# Patient Record
Sex: Female | Born: 1995 | Race: Black or African American | Hispanic: No | Marital: Single | Smoking: Former smoker
Health system: Southern US, Community
[De-identification: ages and names within clinical notes are randomized; demographics above are authoritative.]

## PROBLEM LIST (undated history)

## (undated) DIAGNOSIS — D649 Anemia, unspecified: Secondary | ICD-10-CM

## (undated) HISTORY — PX: DILATION AND CURETTAGE OF UTERUS: SHX78

---

## 2017-07-11 DIAGNOSIS — Z6791 Unspecified blood type, Rh negative: Secondary | ICD-10-CM | POA: Diagnosis not present

## 2017-07-11 DIAGNOSIS — Z131 Encounter for screening for diabetes mellitus: Secondary | ICD-10-CM | POA: Diagnosis not present

## 2017-07-11 DIAGNOSIS — Z3A27 27 weeks gestation of pregnancy: Secondary | ICD-10-CM | POA: Diagnosis not present

## 2017-07-11 DIAGNOSIS — Z114 Encounter for screening for human immunodeficiency virus [HIV]: Secondary | ICD-10-CM | POA: Diagnosis not present

## 2017-07-11 DIAGNOSIS — Z3A25 25 weeks gestation of pregnancy: Secondary | ICD-10-CM | POA: Diagnosis not present

## 2017-07-11 DIAGNOSIS — O26892 Other specified pregnancy related conditions, second trimester: Secondary | ICD-10-CM | POA: Diagnosis not present

## 2017-07-25 DIAGNOSIS — Z131 Encounter for screening for diabetes mellitus: Secondary | ICD-10-CM | POA: Diagnosis not present

## 2017-07-25 DIAGNOSIS — Z3A25 25 weeks gestation of pregnancy: Secondary | ICD-10-CM | POA: Diagnosis not present

## 2017-07-25 DIAGNOSIS — Z3A27 27 weeks gestation of pregnancy: Secondary | ICD-10-CM | POA: Diagnosis not present

## 2017-07-25 DIAGNOSIS — Z114 Encounter for screening for human immunodeficiency virus [HIV]: Secondary | ICD-10-CM | POA: Diagnosis not present

## 2017-07-25 DIAGNOSIS — O26892 Other specified pregnancy related conditions, second trimester: Secondary | ICD-10-CM | POA: Diagnosis not present

## 2017-07-25 DIAGNOSIS — Z6791 Unspecified blood type, Rh negative: Secondary | ICD-10-CM | POA: Diagnosis not present

## 2017-09-29 DIAGNOSIS — M545 Low back pain: Secondary | ICD-10-CM | POA: Diagnosis not present

## 2017-09-29 DIAGNOSIS — O26899 Other specified pregnancy related conditions, unspecified trimester: Secondary | ICD-10-CM | POA: Diagnosis not present

## 2017-10-23 DIAGNOSIS — Z3A40 40 weeks gestation of pregnancy: Secondary | ICD-10-CM | POA: Diagnosis not present

## 2017-10-24 DIAGNOSIS — O480 Post-term pregnancy: Secondary | ICD-10-CM | POA: Diagnosis not present

## 2017-10-24 DIAGNOSIS — Z3A40 40 weeks gestation of pregnancy: Secondary | ICD-10-CM | POA: Diagnosis not present

## 2018-11-21 DIAGNOSIS — Z3201 Encounter for pregnancy test, result positive: Secondary | ICD-10-CM | POA: Diagnosis not present

## 2019-03-03 ENCOUNTER — Emergency Department (HOSPITAL_COMMUNITY): Payer: Medicaid Other

## 2019-03-03 ENCOUNTER — Other Ambulatory Visit: Payer: Self-pay

## 2019-03-03 ENCOUNTER — Encounter (HOSPITAL_COMMUNITY): Payer: Self-pay | Admitting: Emergency Medicine

## 2019-03-03 ENCOUNTER — Emergency Department (HOSPITAL_COMMUNITY)
Admission: EM | Admit: 2019-03-03 | Discharge: 2019-03-04 | Disposition: A | Discharge status: Home / Self Care | Payer: Medicaid Other | Attending: Emergency Medicine | Admitting: Emergency Medicine

## 2019-03-03 DIAGNOSIS — O209 Hemorrhage in early pregnancy, unspecified: Secondary | ICD-10-CM | POA: Diagnosis not present

## 2019-03-03 DIAGNOSIS — Z6711 Type A blood, Rh negative: Secondary | ICD-10-CM | POA: Diagnosis not present

## 2019-03-03 DIAGNOSIS — R1084 Generalized abdominal pain: Secondary | ICD-10-CM | POA: Insufficient documentation

## 2019-03-03 DIAGNOSIS — Z3A00 Weeks of gestation of pregnancy not specified: Secondary | ICD-10-CM | POA: Insufficient documentation

## 2019-03-03 DIAGNOSIS — O039 Complete or unspecified spontaneous abortion without complication: Secondary | ICD-10-CM

## 2019-03-03 LAB — CBC WITH DIFFERENTIAL/PLATELET
Abs Immature Granulocytes: 0.02 10*3/uL (ref 0.00–0.07)
Basophils Absolute: 0 10*3/uL (ref 0.0–0.1)
Basophils Relative: 1 %
Eosinophils Absolute: 0 10*3/uL (ref 0.0–0.5)
Eosinophils Relative: 0 %
HCT: 40.3 % (ref 36.0–46.0)
Hemoglobin: 12.4 g/dL (ref 12.0–15.0)
Immature Granulocytes: 0 %
Lymphocytes Relative: 38 %
Lymphs Abs: 2.7 10*3/uL (ref 0.7–4.0)
MCH: 23.7 pg — ABNORMAL LOW (ref 26.0–34.0)
MCHC: 30.8 g/dL (ref 30.0–36.0)
MCV: 76.9 fL — ABNORMAL LOW (ref 80.0–100.0)
Monocytes Absolute: 0.6 10*3/uL (ref 0.1–1.0)
Monocytes Relative: 8 %
Neutro Abs: 3.7 10*3/uL (ref 1.7–7.7)
Neutrophils Relative %: 53 %
Platelets: 317 10*3/uL (ref 150–400)
RBC: 5.24 MIL/uL — ABNORMAL HIGH (ref 3.87–5.11)
RDW: 13 % (ref 11.5–15.5)
WBC: 7.1 10*3/uL (ref 4.0–10.5)
nRBC: 0 % (ref 0.0–0.2)

## 2019-03-03 LAB — I-STAT BETA HCG BLOOD, ED (MC, WL, AP ONLY): I-stat hCG, quantitative: 14.3 m[IU]/mL — ABNORMAL HIGH (ref <5)

## 2019-03-03 LAB — ABO/RH: ABO/RH(D): A NEG

## 2019-03-03 NOTE — ED Triage Notes (Signed)
Patient is complaining of blood clots, back pain, and cramping that started at 2 am. Patient states she does not know how far along she is. She just found out she is pregnant.

## 2019-03-03 NOTE — ED Provider Notes (Signed)
[facility] DEPT Provider Note   CSN: [id number] Arrival date & time: 03/03/19  2023     History Chief Complaint  Patient presents with  . Miscarriage    Regina Nicholson is a 24 y.o. female.  The history is provided by the patient and medical records.    24 year old G3P1 here with vaginal bleeding.  States her periods are very erratic and she has no idea of her LMP.  She did take 3 home pregnancy tests last week that were all positive.  At 2am today she developed heavy vaginal bleeding with some clots intermixed.  She denies any noted discharge.  She has not been on any form of birth control.  No fevers/chills.  No urinary symptoms.  History reviewed. No pertinent past medical history.  There are no problems to display for this patient.   History reviewed. No pertinent surgical history.   OB History    Gravida  1   Para      Term      Preterm      AB      Living        SAB      TAB      Ectopic      Multiple      Live Births              History reviewed. No pertinent family history.  Social History   Tobacco Use  . Smoking status: Former Research scientist (life sciences)  . Smokeless tobacco: Never Used  Substance Use Topics  . Alcohol use: Yes  . Drug use: Never    Home Medications Prior to Admission medications   Not on File    Allergies    Patient has no known allergies.  Review of Systems   Review of Systems  Genitourinary: Positive for vaginal bleeding.  All other systems reviewed and are negative.   Physical Exam Updated Vital Signs BP (!) 144/94 (BP Location: Left Arm)   Pulse 79   Temp 98.3 F (36.8 C) (Oral)   Resp 16   Ht 5\' 2"  (1.575 m)   Wt 49.9 kg   SpO2 100%   BMI 20.12 kg/m   Physical Exam Vitals and nursing note reviewed.  Constitutional:      Appearance: She is well-developed.  HENT:     Head: Normocephalic and atraumatic.  Eyes:     Conjunctiva/sclera: Conjunctivae normal.     Pupils: Pupils are  equal, round, and reactive to light.  Cardiovascular:     Rate and Rhythm: Normal rate and regular rhythm.     Heart sounds: Normal heart sounds.  Pulmonary:     Effort: Pulmonary effort is normal.     Breath sounds: Normal breath sounds.  Abdominal:     General: Bowel sounds are normal. There is no distension.     Palpations: Abdomen is soft.     Tenderness: There is no abdominal tenderness. There is no rebound.  Musculoskeletal:        General: Normal range of motion.     Cervical back: Normal range of motion.  Skin:    General: Skin is warm and dry.  Neurological:     Mental Status: She is alert and oriented to person, place, and time.     ED Results / Procedures / Treatments   Labs (all labs ordered are listed, but only abnormal results are displayed) Labs Reviewed  CBC WITH DIFFERENTIAL/PLATELET - Abnormal; Notable for the following components:  Result Value   RBC 5.24 (*)    MCV 76.9 (*)    MCH 23.7 (*)    All other components within normal limits  I-STAT BETA HCG BLOOD, ED ([facility], [facility], [facility] ONLY) - Abnormal; Notable for the following components:   I-stat hCG, quantitative 14.3 (*)    All other components within normal limits  ABO/RH  RH IG WORKUP (INCLUDES ABO/RH)    EKG None  Radiology US OB Comp Less 14 Wks  Result Date: 03/04/2019 CLINICAL DATA:  24 year old female with positive HCG level and heavy vaginal bleeding. EXAM: OBSTETRIC <14 WK [place] AND TRANSVAGINAL OB US TECHNIQUE: Both transabdominal and transvaginal ultrasound examinations were performed for complete evaluation of the gestation as well as the maternal uterus, adnexal regions, and pelvic cul-de-sac. Transvaginal technique was performed to assess early pregnancy. COMPARISON:  None. FINDINGS: The uterus is anteverted and measures 8.9 x 6.0 x 5.0 cm. The endometrium is thickened and heterogeneous measuring approximately 2.4 cm in thickness. No intrauterine pregnancy identified. Complex heterogeneous content  within the endometrium likely represents blood product and in keeping with provided history of vaginal bleeding. The ovaries appear unremarkable. Small complex/hemorrhagic follicle noted in the right ovary. Small free fluid within the pelvis. IMPRESSION: No intrauterine pregnancy identified. And no definite adnexal masses noted. Findings in keeping with pregnancy of unknown location and differential diagnosis includes: Early IUP, recent spontaneous abortion, or an occult ectopic pregnancy. Clinical correlation and follow-up with serial HCG levels and repeat ultrasound as clinically indicated, recommended. Large amount of complex material within the endometrium consistent with blood product. Electronically Signed   By: Elgie Collard M.D.   On: 03/04/2019 00:06   US OB Transvaginal  Result Date: 03/04/2019 CLINICAL DATA:  24 year old female with positive HCG level and heavy vaginal bleeding. EXAM: OBSTETRIC <14 WK [place] AND TRANSVAGINAL OB US TECHNIQUE: Both transabdominal and transvaginal ultrasound examinations were performed for complete evaluation of the gestation as well as the maternal uterus, adnexal regions, and pelvic cul-de-sac. Transvaginal technique was performed to assess early pregnancy. COMPARISON:  None. FINDINGS: The uterus is anteverted and measures 8.9 x 6.0 x 5.0 cm. The endometrium is thickened and heterogeneous measuring approximately 2.4 cm in thickness. No intrauterine pregnancy identified. Complex heterogeneous content within the endometrium likely represents blood product and in keeping with provided history of vaginal bleeding. The ovaries appear unremarkable. Small complex/hemorrhagic follicle noted in the right ovary. Small free fluid within the pelvis. IMPRESSION: No intrauterine pregnancy identified. And no definite adnexal masses noted. Findings in keeping with pregnancy of unknown location and differential diagnosis includes: Early IUP, recent spontaneous abortion, or an occult  ectopic pregnancy. Clinical correlation and follow-up with serial HCG levels and repeat ultrasound as clinically indicated, recommended. Large amount of complex material within the endometrium consistent with blood product. Electronically Signed   By: Elgie Collard M.D.   On: 03/04/2019 00:06    Procedures Procedures (including critical care time)  Medications Ordered in ED Medications  rho (d) immune globulin (RHIG/RHOPHYLAC) injection 300 mcg (300 mcg Intramuscular Given 03/04/19 0324)    ED Course  I have reviewed the triage vital signs and the nursing notes.  Pertinent labs & imaging results that were available during my care of the patient were reviewed by me and considered in my medical decision making (see chart for details).    MDM Rules/Calculators/A&P  24 year old female presenting to the ED with concern of miscarriage.  Reports she found out she was pregnant last week with 3+  home pregnancy test, however cannot tell me the date of her last menstrual period as her cycles are erratic.  She reports continued bleeding since about 2 AM, some passage of clots.  She has some mild abdominal cramping but denies any significant pain.  She is afebrile and nontoxic.  Labs are grossly reassuring with stable hemoglobin.  hcg 14, pregnancy was likely very early.  Will obtain ultrasound.  [place] without IUP identified, does have complex material in the endometrium consistent with blood products.  She will need rhogam given A- blood type, given per Pharmacist, Shanda Bumps, at [facility].  Patient will need to follow-up with women's clinic for repeat hcg levels and close re-check.  Return here for any new/acute changes.  Final Clinical Impression(s) / ED Diagnoses Final diagnoses:  Miscarriage    Rx / DC Orders ED Discharge Orders    None       Garlon Hatchet, PA-C 03/04/19 0328    Maia Plan, MD 03/04/19 1350

## 2019-03-04 MED ORDER — RHO D IMMUNE GLOBULIN 1500 UNIT/2ML IJ SOSY
300.0000 ug | PREFILLED_SYRINGE | Freq: Once | INTRAMUSCULAR | Status: AC
  Administered 2019-03-04: 03:00:00 300 ug via INTRAMUSCULAR
  Filled 2019-03-04: qty 2

## 2019-03-04 NOTE — Discharge Instructions (Signed)
You will need to follow-up with women's clinic for repeat hcg levels-- call to make an appt. Return to ED for any new/acute changes.

## 2019-03-04 NOTE — ED Notes (Signed)
Patient was verbalized discharge instructions. Pt had no further questions at this time. NAD. 

## 2019-03-05 ENCOUNTER — Inpatient Hospital Stay (HOSPITAL_COMMUNITY): Payer: Medicaid Other

## 2019-03-05 ENCOUNTER — Encounter (HOSPITAL_COMMUNITY): Payer: Self-pay | Admitting: Obstetrics and Gynecology

## 2019-03-05 ENCOUNTER — Inpatient Hospital Stay (HOSPITAL_COMMUNITY)
Admission: EM | Admit: 2019-03-05 | Discharge: 2019-03-05 | Disposition: A | Discharge status: Home / Self Care | Payer: Medicaid Other | Attending: Obstetrics and Gynecology | Admitting: Obstetrics and Gynecology

## 2019-03-05 ENCOUNTER — Other Ambulatory Visit: Payer: Self-pay

## 2019-03-05 DIAGNOSIS — Z87891 Personal history of nicotine dependence: Secondary | ICD-10-CM | POA: Diagnosis not present

## 2019-03-05 DIAGNOSIS — O209 Hemorrhage in early pregnancy, unspecified: Secondary | ICD-10-CM | POA: Insufficient documentation

## 2019-03-05 DIAGNOSIS — N8311 Corpus luteum cyst of right ovary: Secondary | ICD-10-CM | POA: Diagnosis not present

## 2019-03-05 DIAGNOSIS — Z3A01 Less than 8 weeks gestation of pregnancy: Secondary | ICD-10-CM | POA: Diagnosis not present

## 2019-03-05 DIAGNOSIS — O208 Other hemorrhage in early pregnancy: Secondary | ICD-10-CM | POA: Diagnosis not present

## 2019-03-05 DIAGNOSIS — R1030 Lower abdominal pain, unspecified: Secondary | ICD-10-CM | POA: Diagnosis not present

## 2019-03-05 DIAGNOSIS — O3680X0 Pregnancy with inconclusive fetal viability, not applicable or unspecified: Secondary | ICD-10-CM | POA: Diagnosis not present

## 2019-03-05 DIAGNOSIS — O3480 Maternal care for other abnormalities of pelvic organs, unspecified trimester: Secondary | ICD-10-CM | POA: Diagnosis not present

## 2019-03-05 DIAGNOSIS — O26891 Other specified pregnancy related conditions, first trimester: Secondary | ICD-10-CM | POA: Diagnosis not present

## 2019-03-05 DIAGNOSIS — Z3A00 Weeks of gestation of pregnancy not specified: Secondary | ICD-10-CM | POA: Diagnosis not present

## 2019-03-05 HISTORY — DX: Anemia, unspecified: D64.9

## 2019-03-05 LAB — RH IG WORKUP (INCLUDES ABO/RH)
ABO/RH(D): A NEG
Antibody Screen: POSITIVE
Gestational Age(Wks): 1
Unit division: 0

## 2019-03-05 LAB — URINALYSIS, ROUTINE W REFLEX MICROSCOPIC
Bilirubin Urine: NEGATIVE
Glucose, UA: NEGATIVE mg/dL
Hgb urine dipstick: NEGATIVE
Ketones, ur: NEGATIVE mg/dL
Leukocytes,Ua: NEGATIVE
Nitrite: NEGATIVE
Protein, ur: NEGATIVE mg/dL
Specific Gravity, Urine: 1.036 — ABNORMAL HIGH (ref 1.005–1.030)
pH: 5 (ref 5.0–8.0)

## 2019-03-05 LAB — CBC
HCT: 36.4 % (ref 36.0–46.0)
Hemoglobin: 11.3 g/dL — ABNORMAL LOW (ref 12.0–15.0)
MCH: 23.6 pg — ABNORMAL LOW (ref 26.0–34.0)
MCHC: 31 g/dL (ref 30.0–36.0)
MCV: 76 fL — ABNORMAL LOW (ref 80.0–100.0)
Platelets: 292 10*3/uL (ref 150–400)
RBC: 4.79 MIL/uL (ref 3.87–5.11)
RDW: 12.7 % (ref 11.5–15.5)
WBC: 8.3 10*3/uL (ref 4.0–10.5)
nRBC: 0 % (ref 0.0–0.2)

## 2019-03-05 LAB — WET PREP, GENITAL
Clue Cells Wet Prep HPF POC: NONE SEEN
Sperm: NONE SEEN
Trich, Wet Prep: NONE SEEN
Yeast Wet Prep HPF POC: NONE SEEN

## 2019-03-05 LAB — HCG, QUANTITATIVE, PREGNANCY: hCG, Beta Chain, Quant, S: 16 m[IU]/mL — ABNORMAL HIGH (ref <5)

## 2019-03-05 MED ORDER — LACTATED RINGERS IV BOLUS
1000.0000 mL | Freq: Once | INTRAVENOUS | Status: AC
  Administered 2019-03-05: 1000 mL via INTRAVENOUS

## 2019-03-05 MED ORDER — OXYCODONE-ACETAMINOPHEN 5-325 MG PO TABS
1.0000 | ORAL_TABLET | Freq: Once | ORAL | Status: AC
  Administered 2019-03-05: 1 via ORAL
  Filled 2019-03-05: qty 1

## 2019-03-05 NOTE — Discharge Instructions (Signed)
Return to care   If you have heavier bleeding that soaks through more that 2 pads per hour for an hour or more  If you bleed so much that you feel like you might pass out or you do pass out  If you have significant abdominal pain that is not improved with Tylenol     Vaginal Bleeding During Pregnancy, First Trimester  A small amount of bleeding (spotting) from the vagina is common during early pregnancy. Sometimes the bleeding is normal and does not cause problems. At other times, though, bleeding may be a sign of something serious. Tell your doctor about any bleeding from your vagina right away. Follow these instructions at home: Activity  Follow your doctor's instructions about how active you can be.  If needed, make plans for someone to help with your normal activities.  Do not have sex or orgasms until your doctor says that this is safe. General instructions  Take over-the-counter and prescription medicines only as told by your doctor.  Watch your condition for any changes.  Write down: ? The number of pads you use each day. ? How often you change pads. ? How soaked (saturated) your pads are.  Do not use tampons.  Do not douche.  If you pass any tissue from your vagina, save it to show to your doctor.  Keep all follow-up visits as told by your doctor. This is important. Contact a doctor if:  You have vaginal bleeding at any time while you are pregnant.  You have cramps.  You have a fever. Get help right away if:  You have very bad cramps in your back or belly (abdomen).  You pass large clots or a lot of tissue from your vagina.  Your bleeding gets worse.  You feel light-headed.  You feel weak.  You pass out (faint).  You have chills.  You are leaking fluid from your vagina.  You have a gush of fluid from your vagina. Summary  Sometimes vaginal bleeding during pregnancy is normal and does not cause problems. At other times, bleeding may be a sign  of something serious.  Tell your doctor about any bleeding from your vagina right away.  Follow your doctor's instructions about how active you can be. You may need someone to help you with your normal activities. This information is not intended to replace advice given to you by your health care provider. Make sure you discuss any questions you have with your health care provider. Document Revised: 04/11/2018 Document Reviewed: 03/24/2016 Elsevier Patient Education  2020 Elsevier Inc.  

## 2019-03-05 NOTE — MAU Provider Note (Addendum)
History     CSN: [id number]  Arrival date and time: 03/05/19 1708   First Provider Initiated Contact with Patient 03/05/19 1852      Chief Complaint  Patient presents with  . Abdominal Pain  . Vaginal Bleeding   24 y.o. [id number] @[redacted]w[redacted]d  by unsure LMP presenting with VB. Bleeding started 3 days ago but became heavier today. Reports soaking 3 pads in the last few hrs. She passed a large clot at home. Reports low abdominal cramping intermittently. Rates pain 6/10. Has not taken anything for it.   OB History    Gravida  3   Para  1   Term  1   Preterm  0   AB  1   Living  1     SAB      TAB  1   Ectopic  0   Multiple  0   Live Births  1           Past Medical History:  Diagnosis Date  . Anemia     Past Surgical History:  Procedure Laterality Date  . DILATION AND CURETTAGE OF UTERUS      No family history on file.  Social History   Tobacco Use  . Smoking status: Former Research scientist (life sciences)  . Smokeless tobacco: Never Used  Substance Use Topics  . Alcohol use: Not Currently  . Drug use: Never    Allergies: No Known Allergies  Medications Prior to Admission  Medication Sig Dispense Refill Last Dose  . acetaminophen (TYLENOL) 500 MG tablet Take 1,000 mg by mouth every 6 (six) hours as needed.   03/05/2019 at Unknown time    Review of Systems  Constitutional: Negative for chills and fever.  Gastrointestinal: Positive for abdominal pain.  Genitourinary: Positive for vaginal bleeding.  Musculoskeletal: Positive for back pain.   Physical Exam   Blood pressure 101/74, pulse 91, temperature 98.6 F (37 C), temperature source Oral, resp. rate 16, height 5\' 2"  (1.575 m), weight 49.3 kg, last menstrual period 02/18/2019, SpO2 100 %.  Orthostatic VS for the past 24 hrs:  BP- Lying Pulse- Lying BP- Sitting Pulse- Sitting BP- Standing at 0 minutes Pulse- Standing at 0 minutes  03/05/19 2110 110/71 76 110/75 82 108/73 92  03/05/19 1945 103/71 70 106/71 85 (!) 150/127  115     Physical Exam  Nursing note and vitals reviewed. Constitutional: She is oriented to person, place, and time. She appears well-developed and well-nourished.  HENT:  Head: Normocephalic and atraumatic.  Cardiovascular: Normal rate.  Respiratory: Effort normal. No respiratory distress.  GI: Soft. She exhibits no distension and no mass. There is no abdominal tenderness. There is no rebound and no guarding.  Genitourinary:    Genitourinary Comments: External: no lesions or erythema Vagina: rugated, pink, moist, small amt bloody discharge Uterus: non enlarged, anteverted, non tender, no CMT Adnexae: no masses, no tenderness left, no tenderness right Cervix closed    Musculoskeletal:        General: Normal range of motion.     Cervical back: Normal range of motion.  Neurological: She is alert and oriented to person, place, and time.  Skin: Skin is warm and dry.  Psychiatric: She has a normal mood and affect.   Results for orders placed or performed during the hospital encounter of 03/05/19 (from the past 24 hour(s))  CBC     Status: Abnormal   Collection Time: 03/05/19  6:45 PM  Result Value Ref Range   WBC 8.3  4.0 - 10.5 K/uL   RBC 4.79 3.87 - 5.11 MIL/uL   Hemoglobin 11.3 (L) 12.0 - 15.0 g/dL   HCT 45.8 09.9 - 83.3 %   MCV 76.0 (L) 80.0 - 100.0 fL   MCH 23.6 (L) 26.0 - 34.0 pg   MCHC 31.0 30.0 - 36.0 g/dL   RDW 82.5 05.3 - 97.6 %   Platelets 292 150 - 400 K/uL   nRBC 0.0 0.0 - 0.2 %  hCG, quantitative, pregnancy     Status: Abnormal   Collection Time: 03/05/19  6:45 PM  Result Value Ref Range   hCG, Beta Chain, Quant, S 16 (H) <5 mIU/mL  Wet prep, genital     Status: Abnormal   Collection Time: 03/05/19  7:00 PM   Specimen: PATH Cytology Cervicovaginal Ancillary Only  Result Value Ref Range   Yeast Wet Prep HPF POC NONE SEEN NONE SEEN   Trich, Wet Prep NONE SEEN NONE SEEN   Clue Cells Wet Prep HPF POC NONE SEEN NONE SEEN   WBC, Wet Prep HPF POC MODERATE (A) NONE  SEEN   Sperm NONE SEEN   Urinalysis, Routine w reflex microscopic     Status: Abnormal   Collection Time: 03/05/19  7:22 PM  Result Value Ref Range   Color, Urine YELLOW YELLOW   APPearance CLEAR CLEAR   Specific Gravity, Urine 1.036 (H) 1.005 - 1.030   pH 5.0 5.0 - 8.0   Glucose, UA NEGATIVE NEGATIVE mg/dL   Hgb urine dipstick NEGATIVE NEGATIVE   Bilirubin Urine NEGATIVE NEGATIVE   Ketones, ur NEGATIVE NEGATIVE mg/dL   Protein, ur NEGATIVE NEGATIVE mg/dL   Nitrite NEGATIVE NEGATIVE   Leukocytes,Ua NEGATIVE NEGATIVE   No results found.  MAU Course  Procedures US OB Transvaginal  Result Date: 03/05/2019 CLINICAL DATA:  Heavy bleeding EXAM: TRANSVAGINAL OB ULTRASOUND TECHNIQUE: Transvaginal ultrasound was performed for complete evaluation of the gestation as well as the maternal uterus, adnexal regions, and pelvic cul-de-sac. COMPARISON:  03/03/2019 FINDINGS: Intrauterine gestational sac: None Yolk sac:  Not Visualized. Embryo:  Not Visualized. Maternal uterus/adnexae: Ovaries are within normal limits. The right ovary measures 3.2 by 3.6 x 3.1 cm and contains a corpus luteum. The left ovary measures 1.8 x 2.7 x 2.1 cm. Heterogenous endometrial thickening with prominent vascularity. Trace free fluid. IMPRESSION: 1. No IUP identified.  No adnexal mass identified.  Trace free fluid 2. Heterogenous endometrial thickening with suggestion of increased vascularity, raises concern for retained products of conception in the appropriate clinical setting. Electronically Signed   By: Jasmine Pang M.D.   On: 03/05/2019 21:17    MDM Labs and [place] ordered. Transfer of care given to E.Lyman Bishop, FNP Donette Larry, CNM  03/05/2019 7:58 PM     RH negative - received rhogam last week  Ultrasound today shows no IUP or adnexal mass, thickened endometrium.  HCG today is 16  Repeat orthostatic VS normal after patient received 1 liter of LR bolus & patient reports feeling better. Also reports improved  symptoms after receiving percocet.   Reviewed patient with Dr. Alysia Penna. Will get repeat HCG in the office on Thursday.   Assessment and Plan  A: 1. Pregnancy of unknown anatomic location   2. Vaginal bleeding in pregnancy, first trimester    P: Discharge home Discussed reasons to return to MAU Msg to [facility] for stat HCG on Thursday  Judeth Horn, NP

## 2019-03-05 NOTE — MAU Note (Signed)
Regina Nicholson is a 24 y.o. at [redacted]w[redacted]d here in MAU reporting: day 3 of bleeding and pain. States bleeding is heavy and is passing clots, states the clot that made her come in was the size of a bar of soap. Changing a pad about 3 x per hour, and it was saturated. Sometimes feels lightheaded.  Onset of complaint: 3 days but is getting worse  Pain score: 6/10  Vitals:   03/05/19 1723 03/05/19 1819  BP: 112/85 101/74  Pulse: 93 91  Resp: 16 16  Temp:  98.6 F (37 C)  SpO2: 100% 100%     Lab orders placed from triage: UA, unable to give sample at this time

## 2019-03-05 NOTE — ED Provider Notes (Signed)
MSE was initiated and I personally evaluated the patient and placed orders (if any) at  5:29 PM on March 05, 2019.  The patient appears stable so that the remainder of the MSE may be completed by another provider.  LMP may have been two weeks ago, but she is not sure.  She has had light, intermittent vaginal bleeding for at least the last 2 weeks.  Lower abdominal cramping over the last several days.  Increased vaginal bleeding today, using 2 pads over the last hour.  No diaphoresis.  No pallor.  Pulmonary: No increased work of breathing.  Speaks in full sentences without difficulty. No tachypnea.  Cardiac: No tachycardia  Abdominal: Tender across lower abdomen.  No peritoneal signs.  No rebound tenderness.  No guarding.     Clinical Course as of Mar 04 1733  Mon Mar 05, 2019  1732 Harvie Heck, PA-C Spoke with MAU provider, who agreed to accept the patient.   [SJ]    Clinical Course User Index [SJ] Precious Gilding, [place] 03/05/19 1815    Tegeler, Canary Brim, MD 03/06/19 [phone]

## 2019-03-05 NOTE — MAU Note (Signed)
NT was sent to obtain orthostatics on the patient. During this time the patient became dizzy and went down to the floor with the assistance of the NT. This was reported to the NP and the patient was given fluids. When the fluids were finished I completed another set of orthostatics on the patient and she stated she was fine and that she felt much better.

## 2019-03-06 ENCOUNTER — Telehealth: Payer: Self-pay | Admitting: General Practice

## 2019-03-06 ENCOUNTER — Other Ambulatory Visit: Payer: Medicaid Other

## 2019-03-06 LAB — GC/CHLAMYDIA PROBE AMP (~~LOC~~) NOT AT ARMC
Chlamydia: NEGATIVE
Comment: NEGATIVE
Comment: NORMAL
Neisseria Gonorrhea: NEGATIVE

## 2019-03-06 NOTE — Telephone Encounter (Signed)
Called patient 3x to inform her of appointment scheduled for Thursday, 03/08/2019 at 0850 for Stat HCG and Ortho BP.  Unable to leave message on VM due to VM not set up.

## 2019-03-08 ENCOUNTER — Ambulatory Visit: Payer: Medicaid Other

## 2019-03-13 ENCOUNTER — Ambulatory Visit: Payer: Medicaid Other

## 2019-03-22 ENCOUNTER — Ambulatory Visit (INDEPENDENT_AMBULATORY_CARE_PROVIDER_SITE_OTHER): Payer: Medicaid Other | Admitting: Obstetrics and Gynecology

## 2019-03-22 ENCOUNTER — Other Ambulatory Visit: Payer: Self-pay

## 2019-03-22 ENCOUNTER — Encounter: Payer: Self-pay | Admitting: Obstetrics and Gynecology

## 2019-03-22 VITALS — BP 113/70 | HR 89 | Temp 97.9°F | Wt 109.0 lb

## 2019-03-22 DIAGNOSIS — Z30013 Encounter for initial prescription of injectable contraceptive: Secondary | ICD-10-CM

## 2019-03-22 DIAGNOSIS — O039 Complete or unspecified spontaneous abortion without complication: Secondary | ICD-10-CM

## 2019-03-22 MED ORDER — MEDROXYPROGESTERONE ACETATE 150 MG/ML IM SUSP
150.0000 mg | Freq: Once | INTRAMUSCULAR | Status: AC
  Administered 2019-03-22: 150 mg via INTRAMUSCULAR

## 2019-03-22 NOTE — Progress Notes (Signed)
  GYNECOLOGY PROGRESS NOTE  History:  Regina Nicholson is a 24 y.o. G3P1011 presents to [facility] office today for follow-up after SAB. She has no complaint. She states she stopped bleeding 2-3 days ago. She reports some mild cramping that is tolerable. She denies h/a, dizziness, shortness of breath, n/v, or fever/chills.    The following portions of the patient's history were reviewed and updated as appropriate: allergies, current medications, past family history, past medical history, past social history, past surgical history and problem list.   Review of Systems:  Pertinent items are noted in HPI.   Objective:  Physical Exam Blood pressure 113/70, pulse 89, temperature 97.9 F (36.6 C), temperature source Oral, weight 109 lb (49.4 kg), last menstrual period 02/18/2019, unknown if currently breastfeeding. VS reviewed, nursing note reviewed,  Constitutional: well developed, well nourished, no distress HEENT: normocephalic CV: normal rate Pulm/chest wall: normal effort Breast Exam: deferred Abdomen: soft Neuro: alert and oriented x 3 Skin: warm, dry Psych: affect normal Pelvic exam: deferred  Assessment & Plan:  1. Spontaneous miscarriage - No compliations  2. Encounter for initial prescription of injectable contraceptive - medroxyPROGESTERone (DEPO-PROVERA) injection 150 mg   Raelyn Mora, CNM 3:29 PM

## 2019-03-22 NOTE — Progress Notes (Signed)
   Subjective:  Pt in for Depo Provera injection.    Objective: Need for contraception. No unusual complaints.    Assessment: Pt tolerated Depo injection. Depo given Left upper outer quadrant.   Plan:  Next injection due June 3-17, 2021.    Clovis Pu, RN

## 2019-06-07 ENCOUNTER — Ambulatory Visit: Payer: Medicaid Other | Admitting: Obstetrics and Gynecology

## 2019-08-02 ENCOUNTER — Ambulatory Visit: Payer: Medicaid Other | Admitting: Obstetrics and Gynecology

## 2019-08-07 DIAGNOSIS — N926 Irregular menstruation, unspecified: Secondary | ICD-10-CM | POA: Diagnosis not present

## 2019-11-17 ENCOUNTER — Other Ambulatory Visit: Payer: Self-pay

## 2019-11-17 ENCOUNTER — Inpatient Hospital Stay (HOSPITAL_COMMUNITY)
Admission: AD | Admit: 2019-11-17 | Discharge: 2019-11-17 | Disposition: A | Discharge status: Home / Self Care | Payer: Medicaid Other | Attending: Obstetrics and Gynecology | Admitting: Obstetrics and Gynecology

## 2019-11-17 ENCOUNTER — Encounter (HOSPITAL_COMMUNITY): Payer: Self-pay | Admitting: Obstetrics and Gynecology

## 2019-11-17 ENCOUNTER — Inpatient Hospital Stay (HOSPITAL_COMMUNITY): Payer: Medicaid Other

## 2019-11-17 DIAGNOSIS — O3680X0 Pregnancy with inconclusive fetal viability, not applicable or unspecified: Secondary | ICD-10-CM | POA: Diagnosis not present

## 2019-11-17 DIAGNOSIS — O99891 Other specified diseases and conditions complicating pregnancy: Secondary | ICD-10-CM | POA: Insufficient documentation

## 2019-11-17 DIAGNOSIS — R109 Unspecified abdominal pain: Secondary | ICD-10-CM | POA: Insufficient documentation

## 2019-11-17 DIAGNOSIS — Z3A00 Weeks of gestation of pregnancy not specified: Secondary | ICD-10-CM | POA: Diagnosis not present

## 2019-11-17 DIAGNOSIS — Z87891 Personal history of nicotine dependence: Secondary | ICD-10-CM | POA: Insufficient documentation

## 2019-11-17 DIAGNOSIS — Z3A08 8 weeks gestation of pregnancy: Secondary | ICD-10-CM | POA: Diagnosis not present

## 2019-11-17 DIAGNOSIS — O26899 Other specified pregnancy related conditions, unspecified trimester: Secondary | ICD-10-CM

## 2019-11-17 DIAGNOSIS — N8500 Endometrial hyperplasia, unspecified: Secondary | ICD-10-CM | POA: Diagnosis not present

## 2019-11-17 DIAGNOSIS — O209 Hemorrhage in early pregnancy, unspecified: Secondary | ICD-10-CM | POA: Diagnosis not present

## 2019-11-17 DIAGNOSIS — O26891 Other specified pregnancy related conditions, first trimester: Secondary | ICD-10-CM | POA: Diagnosis not present

## 2019-11-17 LAB — WET PREP, GENITAL
Sperm: NONE SEEN
Trich, Wet Prep: NONE SEEN
Yeast Wet Prep HPF POC: NONE SEEN

## 2019-11-17 LAB — URINALYSIS, ROUTINE W REFLEX MICROSCOPIC
Bacteria, UA: NONE SEEN
Bilirubin Urine: NEGATIVE
Glucose, UA: NEGATIVE mg/dL
Ketones, ur: NEGATIVE mg/dL
Leukocytes,Ua: NEGATIVE
Nitrite: NEGATIVE
Protein, ur: NEGATIVE mg/dL
Specific Gravity, Urine: 1.021 (ref 1.005–1.030)
pH: 6 (ref 5.0–8.0)

## 2019-11-17 LAB — CBC
HCT: 37.4 % (ref 36.0–46.0)
Hemoglobin: 11.6 g/dL — ABNORMAL LOW (ref 12.0–15.0)
MCH: 23.4 pg — ABNORMAL LOW (ref 26.0–34.0)
MCHC: 31 g/dL (ref 30.0–36.0)
MCV: 75.4 fL — ABNORMAL LOW (ref 80.0–100.0)
Platelets: 351 10*3/uL (ref 150–400)
RBC: 4.96 MIL/uL (ref 3.87–5.11)
RDW: 13.4 % (ref 11.5–15.5)
WBC: 11.7 10*3/uL — ABNORMAL HIGH (ref 4.0–10.5)
nRBC: 0 % (ref 0.0–0.2)

## 2019-11-17 LAB — POCT PREGNANCY, URINE: Preg Test, Ur: POSITIVE — AB

## 2019-11-17 LAB — HCG, QUANTITATIVE, PREGNANCY: hCG, Beta Chain, Quant, S: 3282 m[IU]/mL — ABNORMAL HIGH (ref <5)

## 2019-11-17 MED ORDER — OXYCODONE-ACETAMINOPHEN 5-325 MG PO TABS
1.0000 | ORAL_TABLET | ORAL | 0 refills | Status: AC | PRN
Start: 2019-11-17 — End: ?

## 2019-11-17 MED ORDER — OXYCODONE-ACETAMINOPHEN 5-325 MG PO TABS
2.0000 | ORAL_TABLET | Freq: Once | ORAL | Status: AC
  Administered 2019-11-17: 2 via ORAL
  Filled 2019-11-17: qty 2

## 2019-11-17 MED ORDER — RHO D IMMUNE GLOBULIN 1500 UNIT/2ML IJ SOSY
300.0000 ug | PREFILLED_SYRINGE | Freq: Once | INTRAMUSCULAR | Status: AC
  Administered 2019-11-17: 300 ug via INTRAMUSCULAR
  Filled 2019-11-17: qty 2

## 2019-11-17 NOTE — MAU Provider Note (Signed)
History     CSN: [id number]  Arrival date and time: 11/17/19 1313   First Provider Initiated Contact with Patient 11/17/19 1356      Chief Complaint  Patient presents with  . Abdominal Pain  . Back Pain  . Vaginal Bleeding   HPI Regina Nicholson is a 24 y.o. G4P1021 at [redacted]w[redacted]d who presents with vaginal bleeding and abdominal pain. She states the pain started 3 days ago and comes and goes. She rates the pain an 8/10 and has tried ibuprofen with no relief. She states since then, she has had several episodes of heavy bleeding with clots. She states the bleeding has since slowed significantly but she is still seeing some bleeding. She states she knows this bleeding is from a miscarriage because she has had one in the past and is concerned that she is still having pain. She states she went to [facility] to confirm her dating a few weeks ago.   OB History    Gravida  4   Para  1   Term  1   Preterm  0   AB  2   Living  1     SAB  1   TAB  1   Ectopic  0   Multiple  0   Live Births  1           Past Medical History:  Diagnosis Date  . Anemia     Past Surgical History:  Procedure Laterality Date  . DILATION AND CURETTAGE OF UTERUS      History reviewed. No pertinent family history.  Social History   Tobacco Use  . Smoking status: Former Games developer  . Smokeless tobacco: Never Used  Vaping Use  . Vaping Use: Every day  Substance Use Topics  . Alcohol use: Not Currently    Comment: last on 11-12-19  . Drug use: Yes    Types: Marijuana    Comment: last used 11-16-19    Allergies: No Known Allergies  Medications Prior to Admission  Medication Sig Dispense Refill Last Dose  . ibuprofen (ADVIL) 200 MG tablet Take 200 mg by mouth every 6 (six) hours as needed.   Past Week at Unknown time  . acetaminophen (TYLENOL) 500 MG tablet Take 1,000 mg by mouth every 6 (six) hours as needed.       Review of Systems  Constitutional: Negative.  Negative for fatigue and  fever.  HENT: Negative.   Respiratory: Negative.  Negative for shortness of breath.   Cardiovascular: Negative.  Negative for chest pain.  Gastrointestinal: Positive for abdominal pain. Negative for constipation, diarrhea, nausea and vomiting.  Genitourinary: Positive for vaginal bleeding. Negative for dysuria and vaginal discharge.  Musculoskeletal: Positive for back pain.  Neurological: Negative.  Negative for dizziness and headaches.   Physical Exam   Blood pressure 112/78, pulse 64, temperature 98.4 F (36.9 C), temperature source Oral, resp. rate 15, height 5\' 2"  (1.575 m), weight 50.3 kg, last menstrual period 09/17/2019, SpO2 99 %, unknown if currently breastfeeding.  Physical Exam Vitals and nursing note reviewed.  Constitutional:      General: She is not in acute distress.    Appearance: She is well-developed.  HENT:     Head: Normocephalic.  Eyes:     Pupils: Pupils are equal, round, and reactive to light.  Cardiovascular:     Rate and Rhythm: Normal rate and regular rhythm.     Heart sounds: Normal heart sounds.  Pulmonary:  Effort: Pulmonary effort is normal. No respiratory distress.     Breath sounds: Normal breath sounds.  Abdominal:     General: Bowel sounds are normal. There is no distension.     Palpations: Abdomen is soft.     Tenderness: There is no abdominal tenderness.  Skin:    General: Skin is warm and dry.  Neurological:     Mental Status: She is alert and oriented to person, place, and time.  Psychiatric:        Behavior: Behavior normal.        Thought Content: Thought content normal.        Judgment: Judgment normal.     MAU Course  Procedures Results for orders placed or performed during the hospital encounter of 11/17/19 (from the past 24 hour(s))  Urinalysis, Routine w reflex microscopic     Status: Abnormal   Collection Time: 11/17/19  1:30 PM  Result Value Ref Range   Color, Urine YELLOW YELLOW   APPearance CLEAR CLEAR   Specific  Gravity, Urine 1.021 1.005 - 1.030   pH 6.0 5.0 - 8.0   Glucose, UA NEGATIVE NEGATIVE mg/dL   Hgb urine dipstick MODERATE (A) NEGATIVE   Bilirubin Urine NEGATIVE NEGATIVE   Ketones, ur NEGATIVE NEGATIVE mg/dL   Protein, ur NEGATIVE NEGATIVE mg/dL   Nitrite NEGATIVE NEGATIVE   Leukocytes,Ua NEGATIVE NEGATIVE   RBC / HPF 21-50 0 - 5 RBC/hpf   WBC, UA 0-5 0 - 5 WBC/hpf   Bacteria, UA NONE SEEN NONE SEEN   Squamous Epithelial / LPF 0-5 0 - 5   Mucus PRESENT   Pregnancy, urine POC     Status: Abnormal   Collection Time: 11/17/19  1:30 PM  Result Value Ref Range   Preg Test, Ur POSITIVE (A) NEGATIVE  CBC     Status: Abnormal   Collection Time: 11/17/19  1:39 PM  Result Value Ref Range   WBC 11.7 (H) 4.0 - 10.5 K/uL   RBC 4.96 3.87 - 5.11 MIL/uL   Hemoglobin 11.6 (L) 12.0 - 15.0 g/dL   HCT 88.4 36 - 46 %   MCV 75.4 (L) 80.0 - 100.0 fL   MCH 23.4 (L) 26.0 - 34.0 pg   MCHC 31.0 30.0 - 36.0 g/dL   RDW 16.6 06.3 - 01.6 %   Platelets 351 150 - 400 K/uL   nRBC 0.0 0.0 - 0.2 %  hCG, quantitative, pregnancy     Status: Abnormal   Collection Time: 11/17/19  1:39 PM  Result Value Ref Range   hCG, Beta Chain, Quant, S 3,282 (H) <5 mIU/mL  Rh IG workup (includes ABO/Rh)     Status: None (Preliminary result)   Collection Time: 11/17/19  1:39 PM  Result Value Ref Range   Gestational Age(Wks) 8    ABO/RH(D) A NEG    Antibody Screen      NEG Performed at [facility], [address] [place], [place] [address]    Unit Number [id number]    Blood Component Type RHIG    Unit division 00    Status of Unit ALLOCATED    Transfusion Status OK TO TRANSFUSE   Wet prep, genital     Status: Abnormal   Collection Time: 11/17/19  2:03 PM  Result Value Ref Range   Yeast Wet Prep HPF POC NONE SEEN NONE SEEN   Trich, Wet Prep NONE SEEN NONE SEEN   Clue Cells Wet Prep HPF POC PRESENT (A) NONE SEEN  WBC, Wet Prep HPF POC FEW (A) NONE SEEN   Sperm NONE SEEN    US OB LESS THAN 14 WEEKS WITH  OB TRANSVAGINAL  Result Date: 11/17/2019 CLINICAL DATA:  Abdominal pain and bleeding EXAM: OBSTETRIC <14 WK [place] AND TRANSVAGINAL OB US TECHNIQUE: Both transabdominal and transvaginal ultrasound examinations were performed for complete evaluation of the gestation as well as the maternal uterus, adnexal regions, and pelvic cul-de-sac. Transvaginal technique was performed to assess early pregnancy. COMPARISON:  None. FINDINGS: Intrauterine gestational sac: None LMP: 09/19/2019. Right ovary: There are multiple follicles seen in the RIGHT ovary. The RIGHT ovary measures 3.6 x 2.7 x 2.6 cm. Blood flow is documented within the RIGHT ovary. Left ovary: There is a likely corpus luteal cyst within the LEFT ovary. The LEFT ovary measures 2.8 x 3.4 x 2.6 cm. Blood flow is documented within the LEFT ovary. Other :Endometrium is diffusely thickened and heterogeneous in appearance. It measures approximately 17 mm in thickness. Free fluid:  None IMPRESSION: 1. No intrauterine gestational sac, yolk sac, or fetal pole identified. In the setting of positive pregnancy test and no definite intrauterine pregnancy, this reflects a pregnancy of unknown location. Differential considerations include early normal IUP, abnormal IUP, or nonvisualized ectopic pregnancy. Differentiation is achieved with serial beta HCG supplemented by repeat sonography as clinically warranted. Electronically Signed   By: Meda Klinefelter MD   On: 11/17/2019 14:54   MDM UA, UPT CBC, HCG ABO/Rh- O Neg [place] work up [employer] prep and gc/chlamydia US OB Comp Less 14 weeks with Transvaginal Percocet PO  Consulted with Dr. Vergie Living due to HCG >1500 and no IUP seen- unable to confirm dating by ultrasound reported to patient so will treat as pregnancy of unknown location and have patient repeat HCG in 2 days.   Discussed with client the diagnosis of pregnancy of unknown anatomic location.  Three possibilities of outcome are: a healthy pregnancy  that is too early to see a yolk sac to confirm the pregnancy is in the uterus, a pregnancy that is not healthy and has not developed and will not develop, and an ectopic pregnancy that is in the abdomen that cannot be identified at this time.  And ectopic pregnancy can be a life threatening situation as a pregnancy needs to be in the uterus which is a muscle and can stretch to accommodate the growth of a pregnancy.  Other structures in the pelvis and abdomen as not muscular and do not stretch with the growth of a pregnancy.  Worst case scenario is that a structure ruptures with a growing pregnancy not in the uterus and and internal hemorrhage can be a life threatening situation.  We need to follow the progression of this pregnancy carefully.  We need to check another serum pregnancy hormone level to determine if the levels are rising appropriately  and to determine the next steps that are needed for you. Patient's questions were answered.   Assessment and Plan   1. Pregnancy of unknown anatomic location   2. Abdominal pain affecting pregnancy   3. [redacted] weeks gestation of pregnancy    -Discharge home in stable condition -Strict ectopic precautions discussed -Patient advised to follow-up with [facility] on Monday 11/15 at 1400 for repeat blood work  -Patient may return to MAU as needed or if her condition were to change or worsen   Rolm Bookbinder CNM 11/17/2019, 3:17 PM

## 2019-11-17 NOTE — MAU Note (Signed)
Regina Nicholson is a 24 y.o. at [redacted]w[redacted]d here in MAU reporting: since Thursday has been having back and abdominal pain. States she is also bleeding, states she has had a few clots and soaked a few pads. Thinks she is miscarrying.   LMP: 09/17/19  Onset of complaint: ongoing  Pain score: back 7/10, abdomen 7/10  Vitals:   11/17/19 1336  BP: 112/78  Pulse: 64  Resp: 15  Temp: 98.4 F (36.9 C)  SpO2: 99%     Lab orders placed from triage: UA, UPT

## 2019-11-18 LAB — RH IG WORKUP (INCLUDES ABO/RH)
ABO/RH(D): A NEG
Antibody Screen: NEGATIVE
Gestational Age(Wks): 8
Unit division: 0

## 2019-11-19 ENCOUNTER — Telehealth: Payer: Self-pay | Admitting: *Deleted

## 2019-11-19 ENCOUNTER — Ambulatory Visit: Payer: Medicaid Other

## 2019-11-19 LAB — GC/CHLAMYDIA PROBE AMP (~~LOC~~) NOT AT ARMC
Chlamydia: NEGATIVE
Comment: NEGATIVE
Comment: NORMAL
Neisseria Gonorrhea: NEGATIVE

## 2019-11-19 NOTE — Telephone Encounter (Signed)
Called pt and asked if she can come in @ 1315 for the appt today which is scheduled @ 1400. Pt responded yes and stated that her pain has not let up. I advised that we will be discussing her pain and bleeding during the appt. Pt voiced understanding.

## 2019-11-20 ENCOUNTER — Other Ambulatory Visit: Payer: Self-pay

## 2019-11-20 ENCOUNTER — Ambulatory Visit (INDEPENDENT_AMBULATORY_CARE_PROVIDER_SITE_OTHER): Payer: Medicaid Other | Admitting: *Deleted

## 2019-11-20 ENCOUNTER — Encounter: Payer: Self-pay | Admitting: *Deleted

## 2019-11-20 VITALS — BP 109/73 | HR 69 | Ht 62.0 in | Wt 112.1 lb

## 2019-11-20 DIAGNOSIS — O039 Complete or unspecified spontaneous abortion without complication: Secondary | ICD-10-CM

## 2019-11-20 DIAGNOSIS — O3680X0 Pregnancy with inconclusive fetal viability, not applicable or unspecified: Secondary | ICD-10-CM | POA: Diagnosis not present

## 2019-11-20 LAB — BETA HCG QUANT (REF LAB): hCG Quant: 652 m[IU]/mL

## 2019-11-20 NOTE — Progress Notes (Signed)
Chart reviewed for nurse visit. Agree with plan of care.   Venora Maples, MD 11/20/19 4:57 PM

## 2019-11-20 NOTE — Progress Notes (Signed)
Pt presents for stat BHCG following visit to MAU on 11/13. She reports that bleeding is still bright red however is much less. She continues to have some pain - more when she is active. She is taking oxycodone at night for the pain and occasionally ibuprofen during the [facility] [place]. Pt was advised that she should go to MAU for evaluation and treatment if heavy bleeding or severe pain reoccurs. She will be called with test results and next steps in plan of care later today. She stated that a detailed message can be left on her VM if she does not answer. Pt was noted to have elevated PHQ-9 and GAD-7 scores today. She was offered [facility] appt and declined. Pt was advised that she may schedule [facility] appt in the future if desired.   1530 BHCG results reviewed by Dr. Crissie Reese who finds decrease in level consistent with miscarriage. Pt will need weekly non-stat BHCG done until level is <5. I called pt and informed her of results which are consistent with a miscarriage. She was informed of need for weekly lab draw until hormone level is <5. She requested appt before 10:00 am on any Tahja Liao. I advised that she will be called with appt information. Pt voiced understanding of all information given.

## 2019-11-27 ENCOUNTER — Other Ambulatory Visit: Payer: Medicaid Other

## 2020-04-25 DIAGNOSIS — N926 Irregular menstruation, unspecified: Secondary | ICD-10-CM | POA: Diagnosis not present

## 2020-05-06 ENCOUNTER — Ambulatory Visit: Payer: Medicaid Other | Admitting: Obstetrics and Gynecology

## 2020-09-07 IMAGING — US US OB COMP LESS 14 WK
1 series · 13 of 28 positions shown · non-contrast
Comparison: None.

CLINICAL DATA: 23-year-old female with positive HCG level and heavy
vaginal bleeding.

EXAM:
OBSTETRIC <14 WK US AND TRANSVAGINAL OB US
TECHNIQUE: Both transabdominal and transvaginal ultrasound examinations were
performed for complete evaluation of the gestation as well as the
maternal uterus, adnexal regions, and pelvic cul-de-sac.
Transvaginal technique was performed to assess early pregnancy.

[Series 1: us ob comp less 14 wk · 13 of 90 slices shown]
[im 4/90]
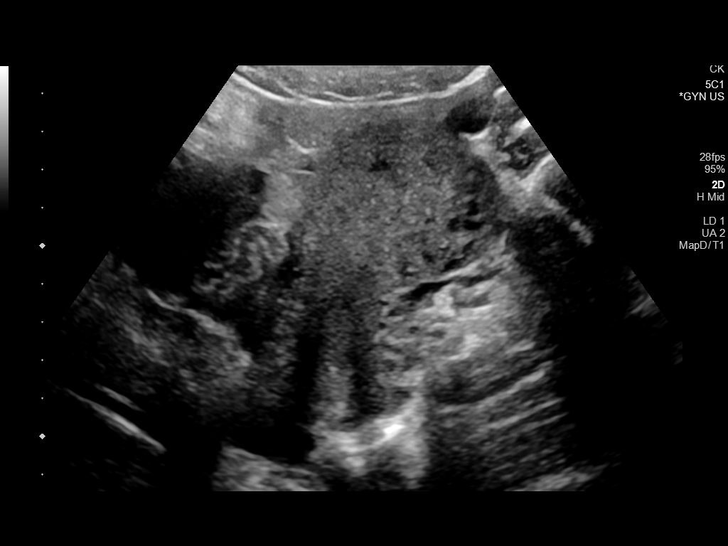
[im 10/90]
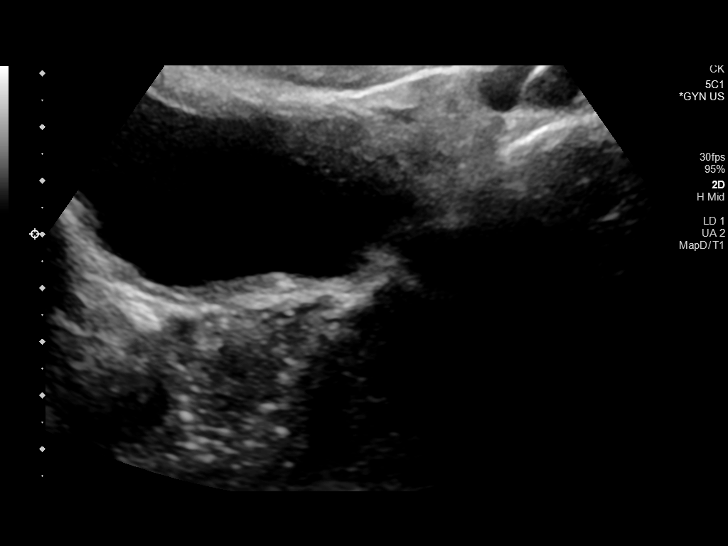
[im 17/90]
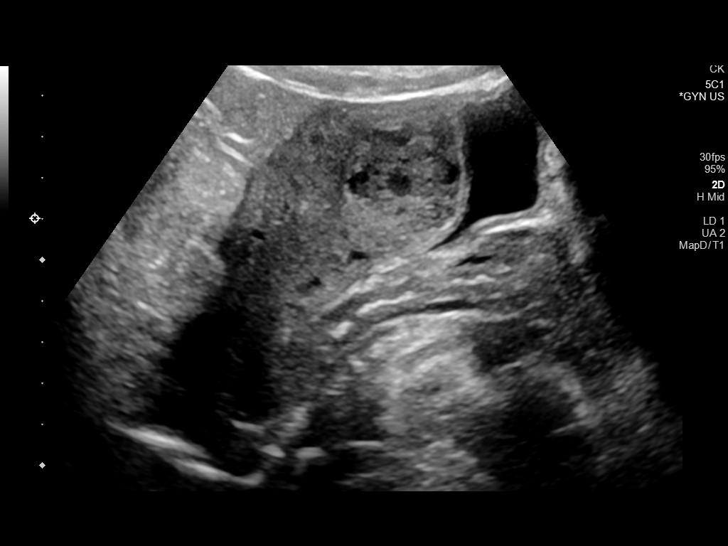
[im 24/90]
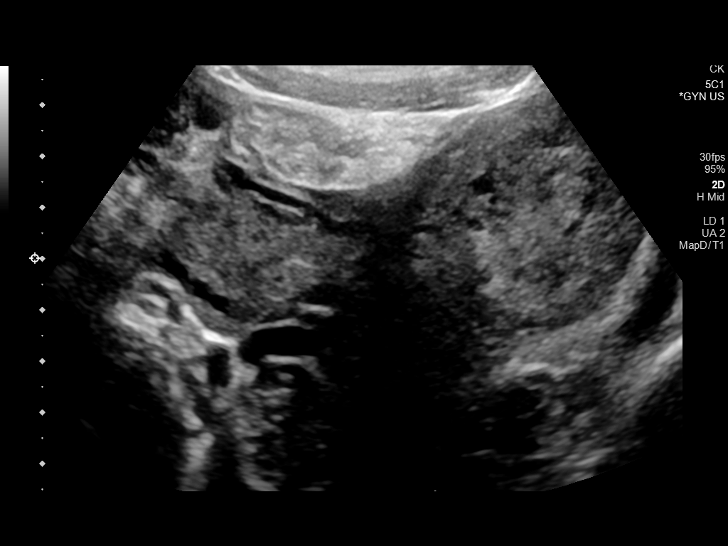
[im 30/90]
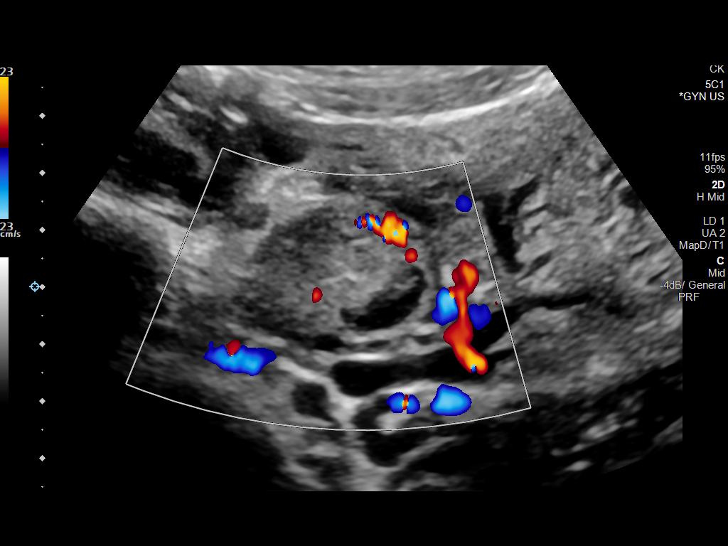
[im 37/90]
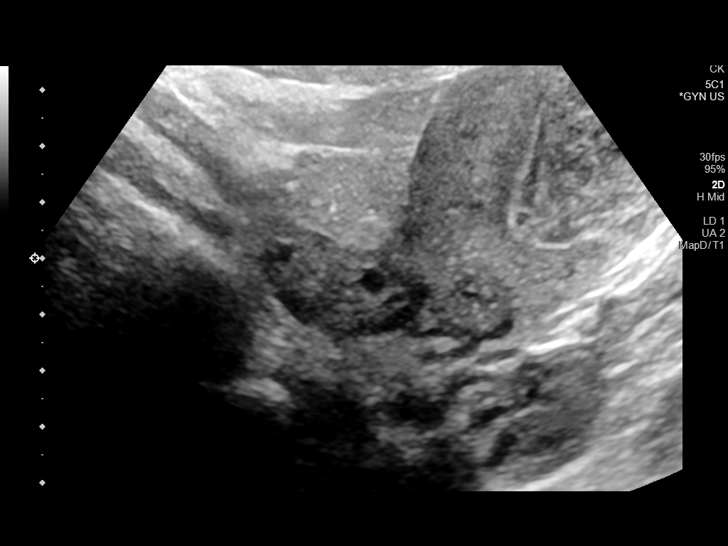
[im 47/90]
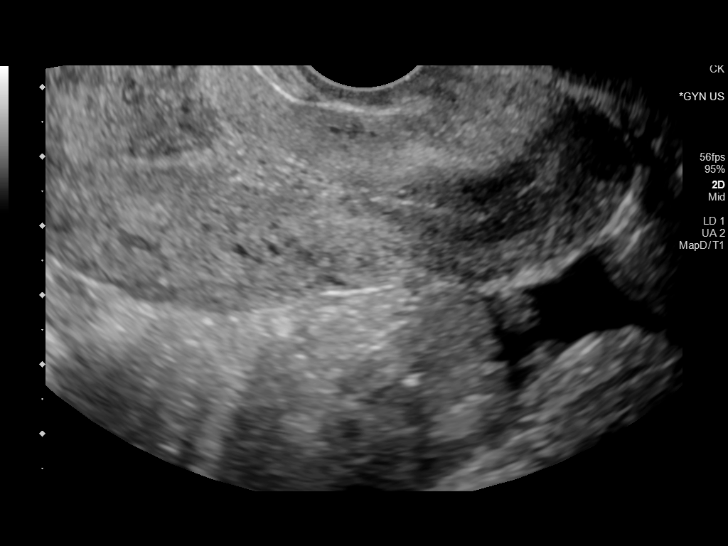
[im 53/90]
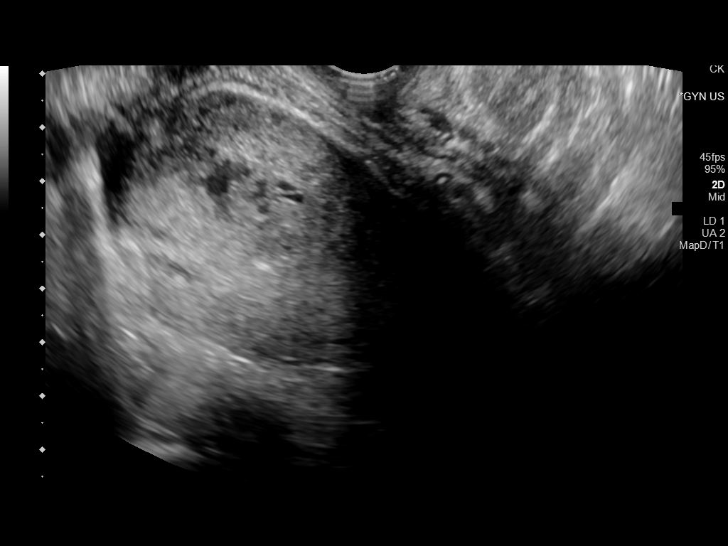
[im 60/90]
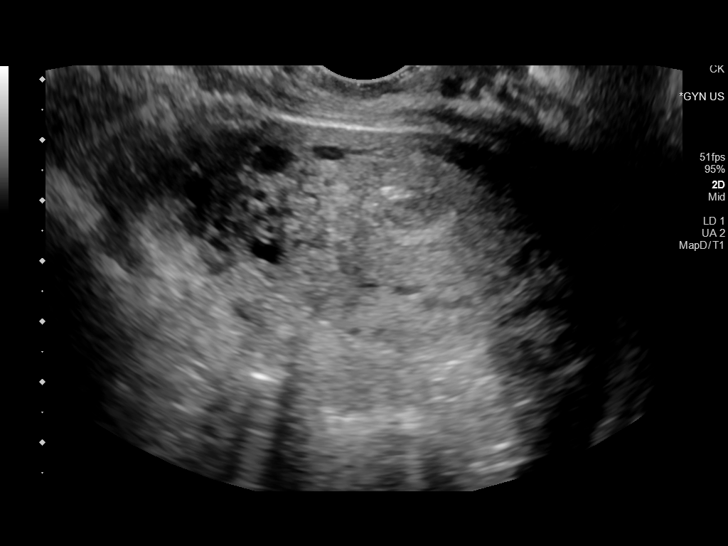
[im 66/90]
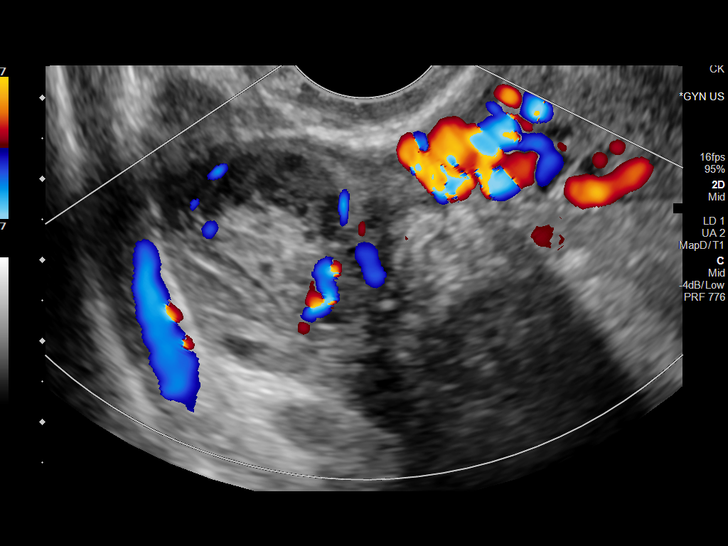
[im 73/90]
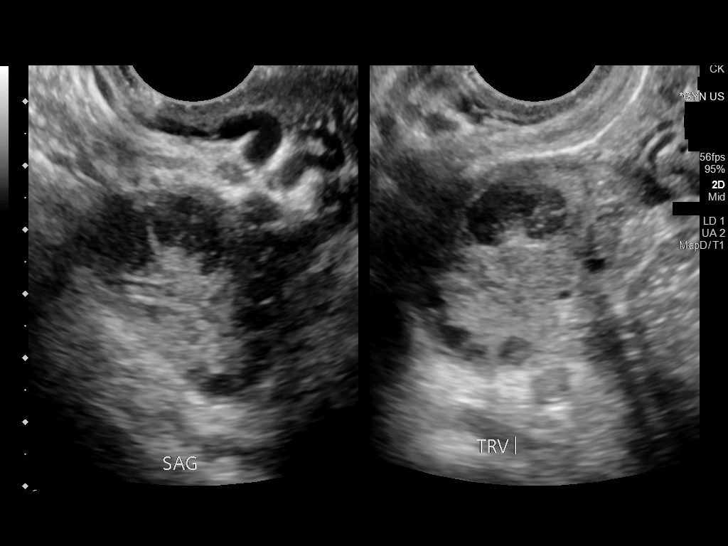
[im 80/90]
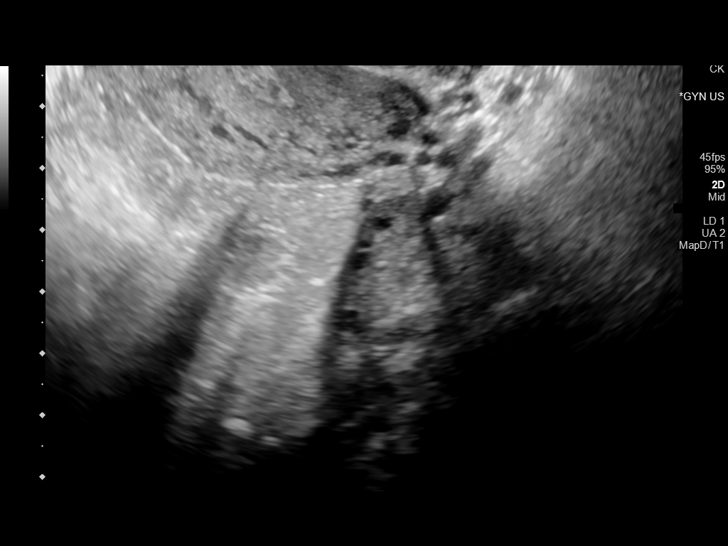
[im 86/90]
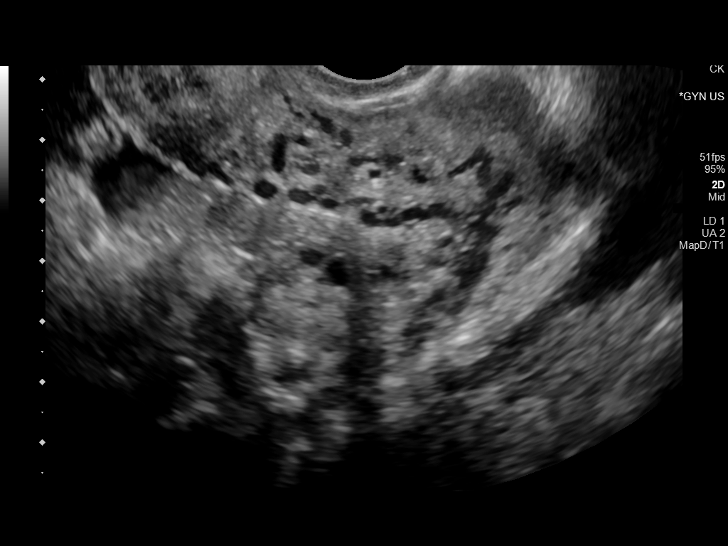

[13 of 28 positions shown; findings below may reference images not displayed]

FINDINGS: The uterus is anteverted and measures 8.9 x 6.0 x 5.0 cm.

The endometrium is thickened and heterogeneous measuring
approximately 2.4 cm in thickness. No intrauterine pregnancy
identified. Complex heterogeneous content within the endometrium
likely represents blood product and in keeping with provided history
of vaginal bleeding.

The ovaries appear unremarkable. Small complex/hemorrhagic follicle
noted in the right ovary.

Small free fluid within the pelvis.
IMPRESSION: No intrauterine pregnancy identified. And no definite adnexal masses
noted. Findings in keeping with pregnancy of unknown location and
differential diagnosis includes: Early IUP, recent spontaneous
abortion, or an occult ectopic pregnancy. Clinical correlation and
follow-up with serial HCG levels and repeat ultrasound as clinically
indicated, recommended.

Large amount of complex material within the endometrium consistent
with blood product.

## 2020-09-09 IMAGING — US US OB TRANSVAGINAL
1 series · 15 of 28 positions shown · non-contrast
Comparison: 03/03/2019

CLINICAL DATA: Heavy bleeding

EXAM:
TRANSVAGINAL OB ULTRASOUND
TECHNIQUE: Transvaginal ultrasound was performed for complete evaluation of the
gestation as well as the maternal uterus, adnexal regions, and
pelvic cul-de-sac.

[Series 1: us ob transvaginal · 15 of 41 slices shown]
[im 1/41]
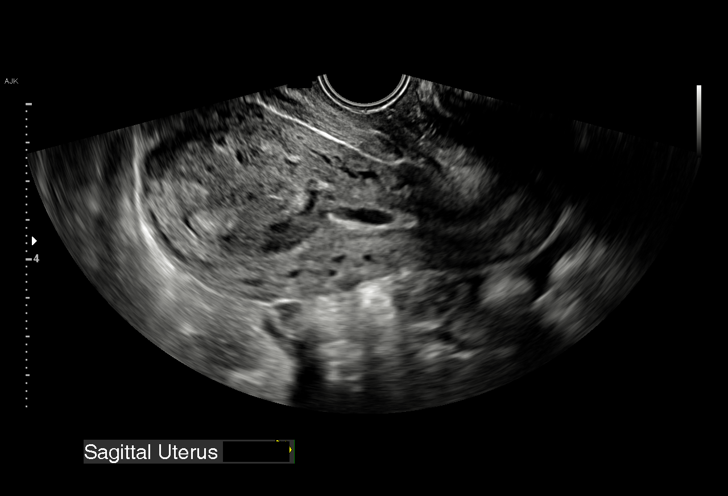
[im 3/41]
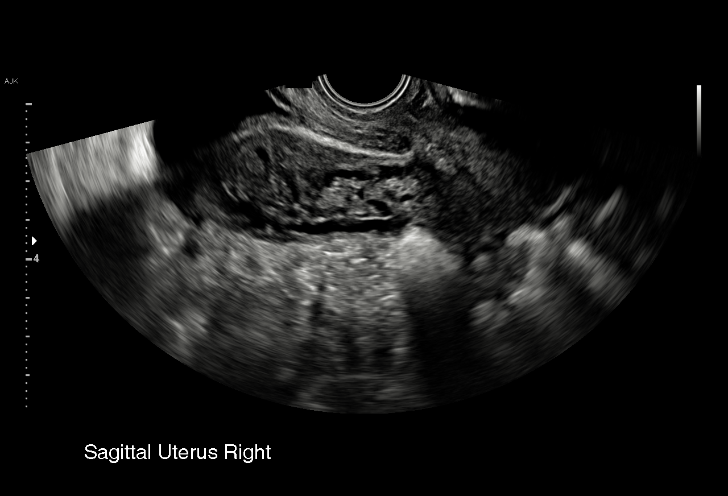
[im 6/41]
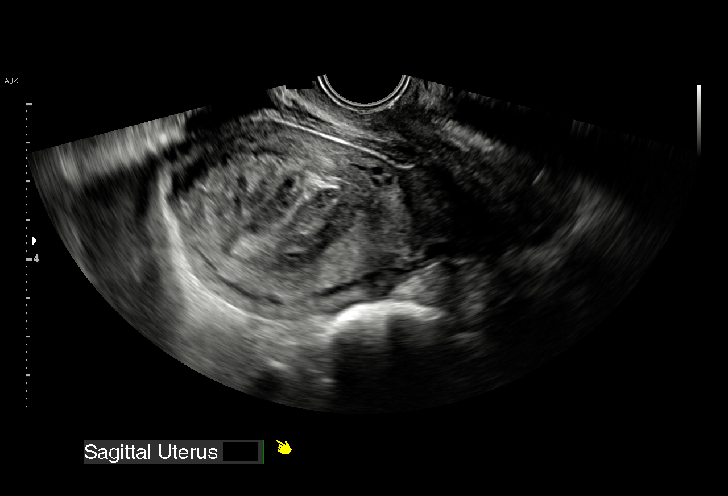
[im 9/41]
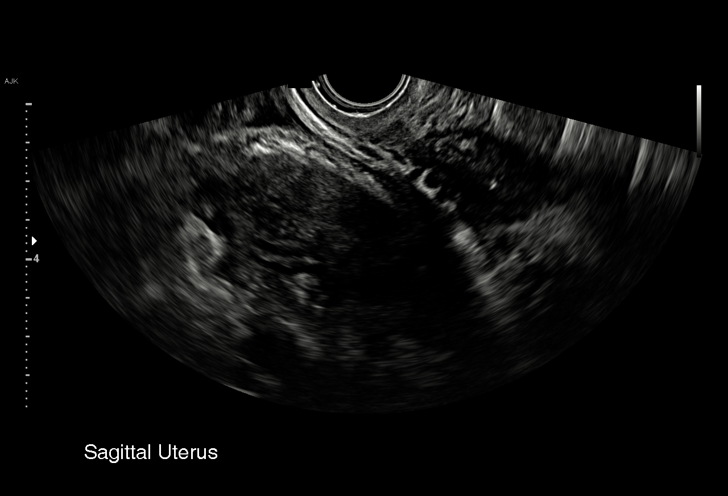
[im 12/41]
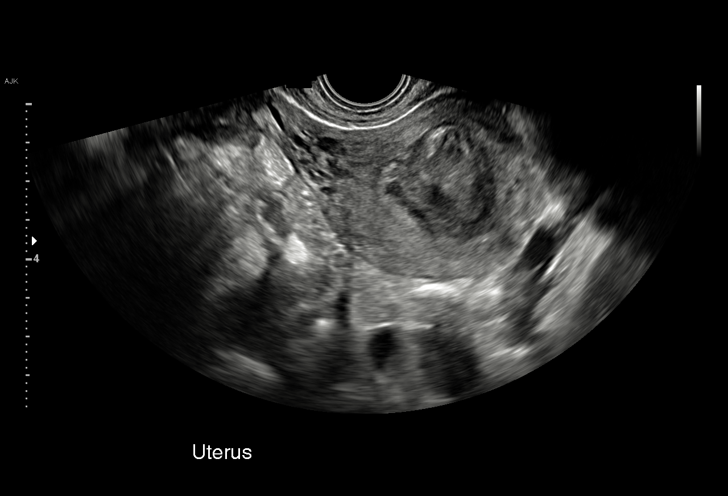
[im 15/41]
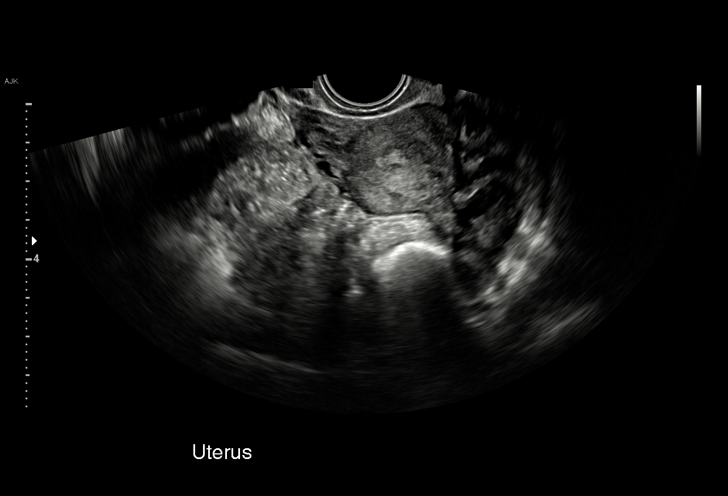
[im 18/41]
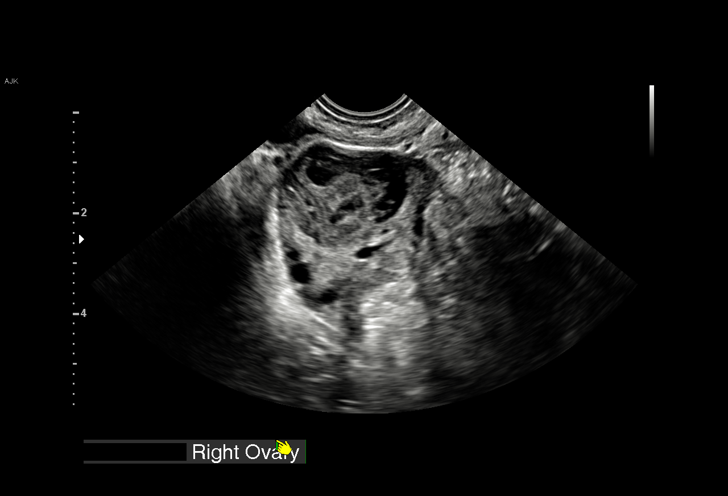
[im 21/41]
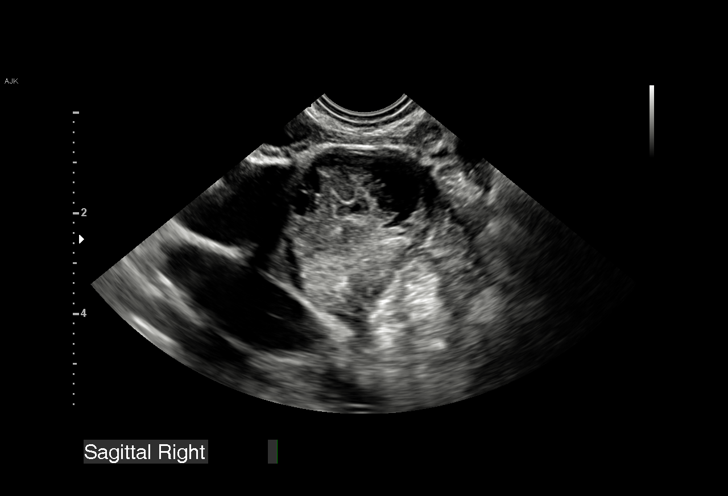
[im 23/41]
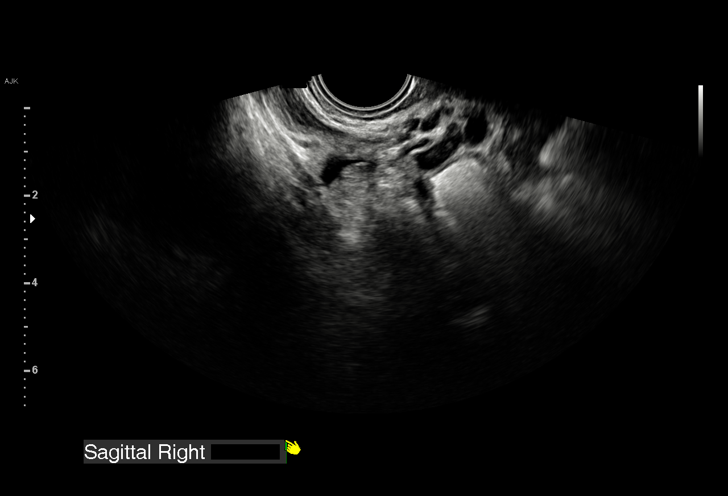
[im 26/41]
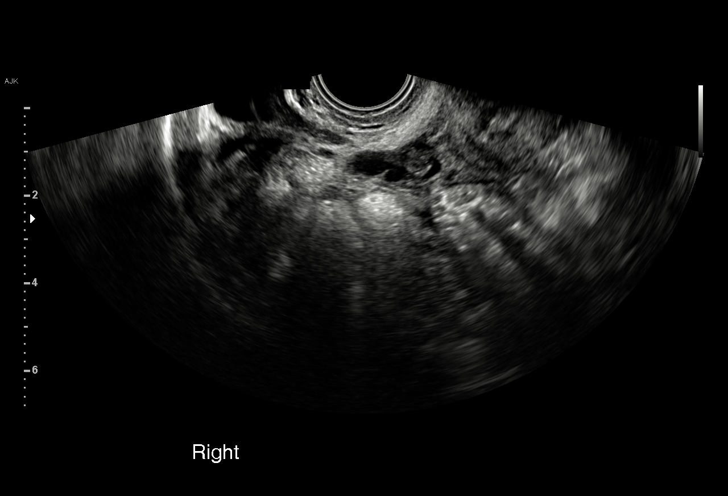
[im 29/41]
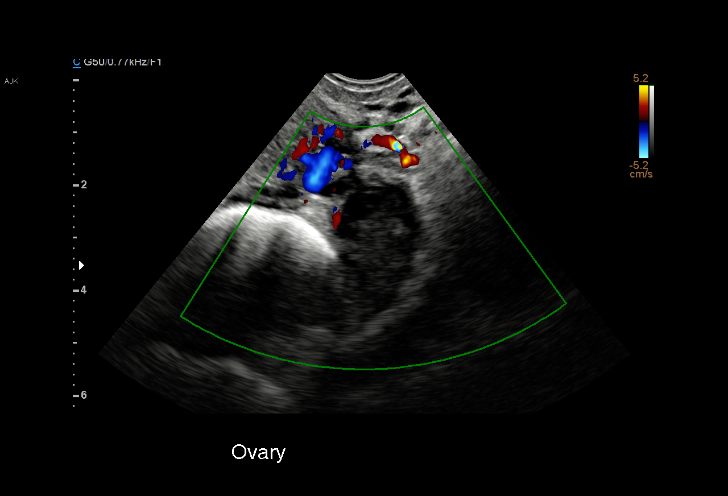
[im 32/41]
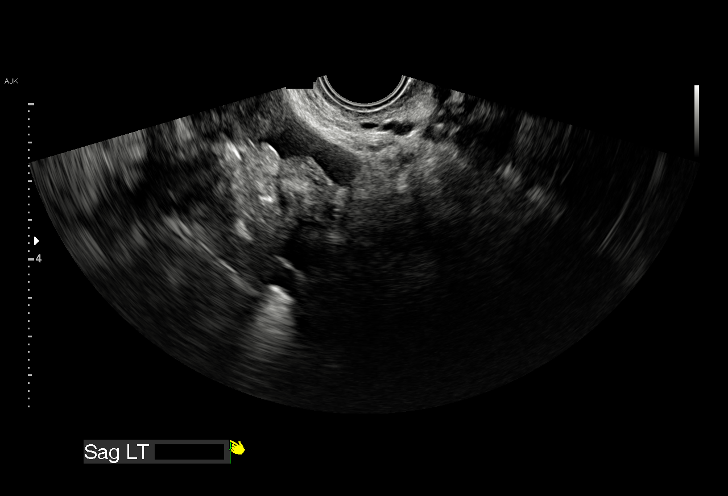
[im 35/41]
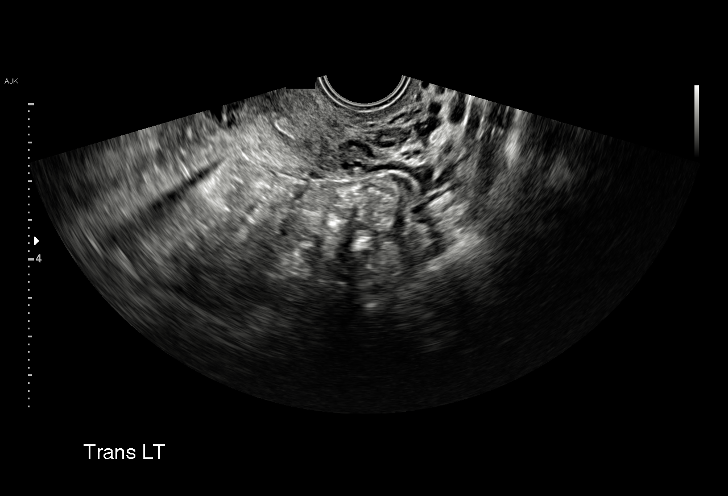
[im 38/41]
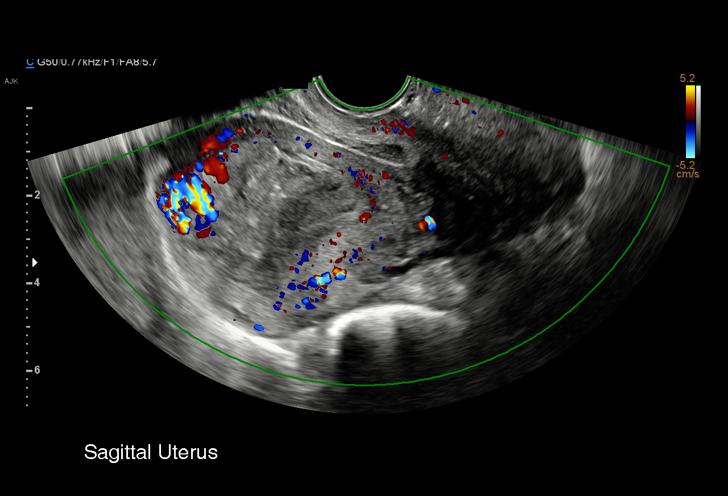
[im 41/41]
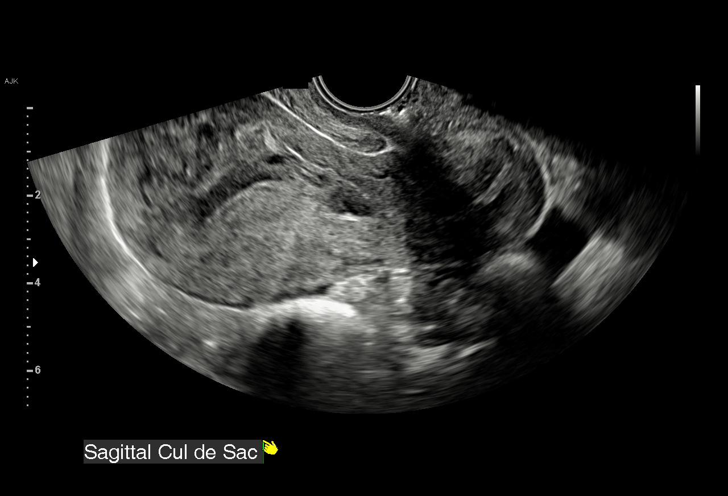

[15 of 28 positions shown; findings below may reference images not displayed]

FINDINGS: Intrauterine gestational sac: None

Yolk sac:  Not Visualized.

Embryo:  Not Visualized.

Maternal uterus/adnexae: Ovaries are within normal limits. The right
ovary measures 3.2 by 3.6 x 3.1 cm and contains a corpus luteum. The
left ovary measures 1.8 x 2.7 x 2.1 cm. Heterogenous endometrial
thickening with prominent vascularity. Trace free fluid.
IMPRESSION: 1. No IUP identified.  No adnexal mass identified.  Trace free fluid
2. Heterogenous endometrial thickening with suggestion of increased
vascularity, raises concern for retained products of conception in
the appropriate clinical setting.

## 2020-10-01 DIAGNOSIS — Z113 Encounter for screening for infections with a predominantly sexual mode of transmission: Secondary | ICD-10-CM | POA: Diagnosis not present

## 2020-10-01 DIAGNOSIS — Z114 Encounter for screening for human immunodeficiency virus [HIV]: Secondary | ICD-10-CM | POA: Diagnosis not present

## 2021-05-18 ENCOUNTER — Telehealth: Payer: Self-pay

## 2021-05-18 NOTE — Telephone Encounter (Signed)
.. ?  Medicaid Managed Care  ? ?Unsuccessful Outreach Note ? ?05/18/2021 ?Name: Regina Nicholson MRN: [id number] DOB: 08/01/1995 ? ?Referred by: Patient, No Pcp Per (Inactive) ?Reason for referral : High Risk Managed Medicaid (I called the patient today to get her enrolled with the [employer] services. Patient did not answer and her phone said she was not accepting any messages at this time.) ? ? ?An unsuccessful telephone outreach was attempted today. The patient was referred to the case management team for assistance with care management and care coordination.  ? ?Follow Up Plan: The care management team will reach out to the patient again over the next 14 days.  ? ? ?Weston Settle ?Care Guide, High Risk Medicaid Managed Care ?Embedded Care Coordination ?[employer]  [facility]  ? ? ?  ?

## 2021-05-24 IMAGING — US US OB < 14 WEEKS - US OB TV
1 series · 15 of 28 positions shown · non-contrast
Comparison: None.

CLINICAL DATA: Abdominal pain and bleeding

EXAM:
OBSTETRIC <14 WK US AND TRANSVAGINAL OB US
TECHNIQUE: Both transabdominal and transvaginal ultrasound examinations were
performed for complete evaluation of the gestation as well as the
maternal uterus, adnexal regions, and pelvic cul-de-sac.
Transvaginal technique was performed to assess early pregnancy.

[Series 1: us ob < 14 weeks - us ob tv · 46 acquisitions, 15 frames shown]
[im 1/46]
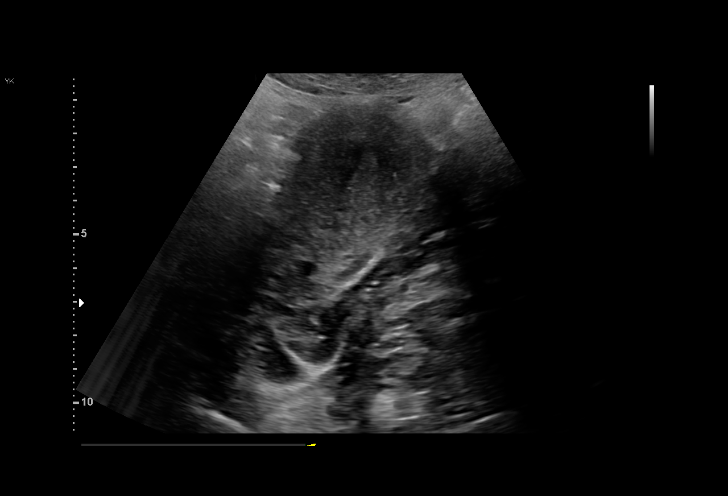
[im 4/46]
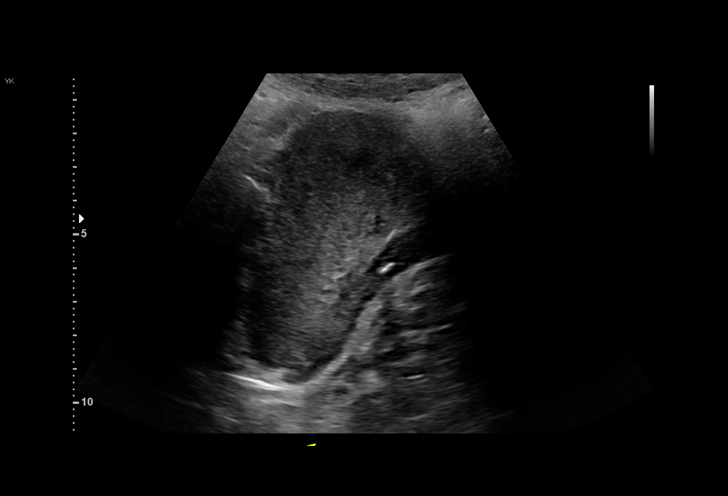
[im 7/46]
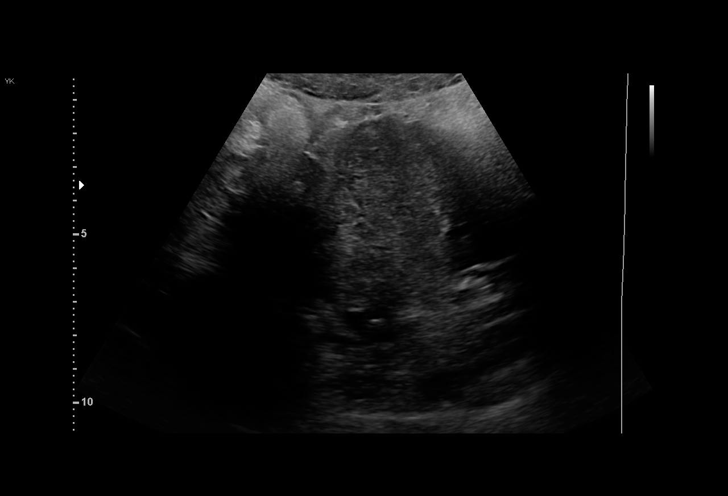
[im 11/46]
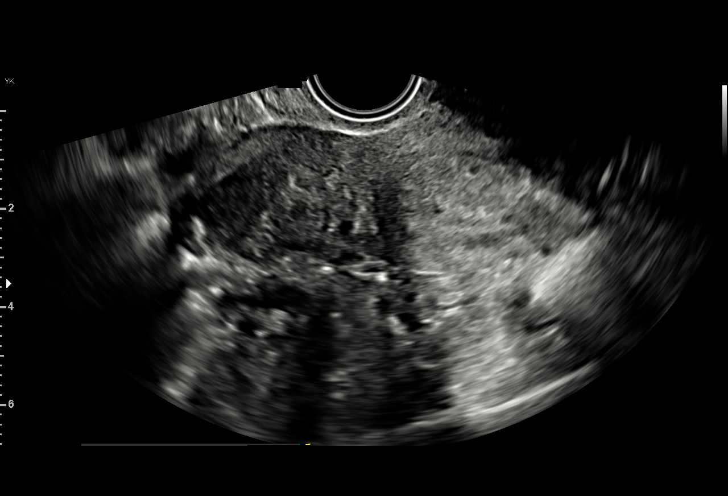
[im 14/46]
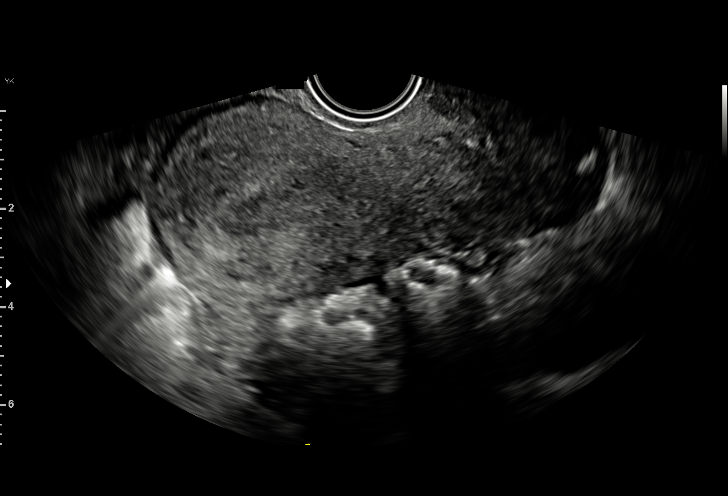
[im 17/46]
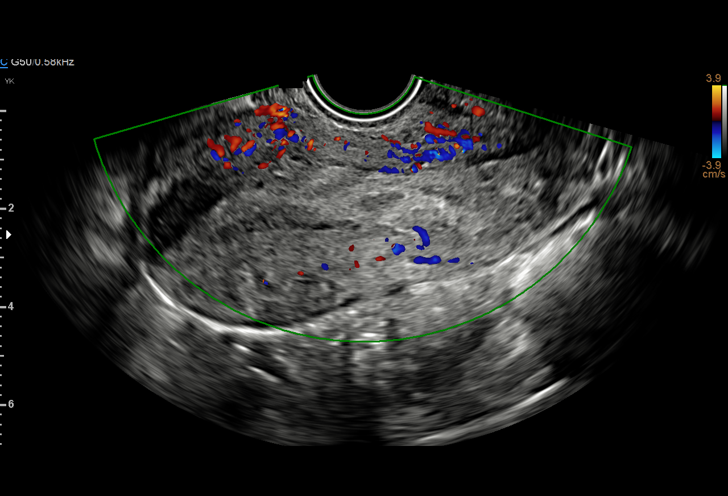
[im 21/46]
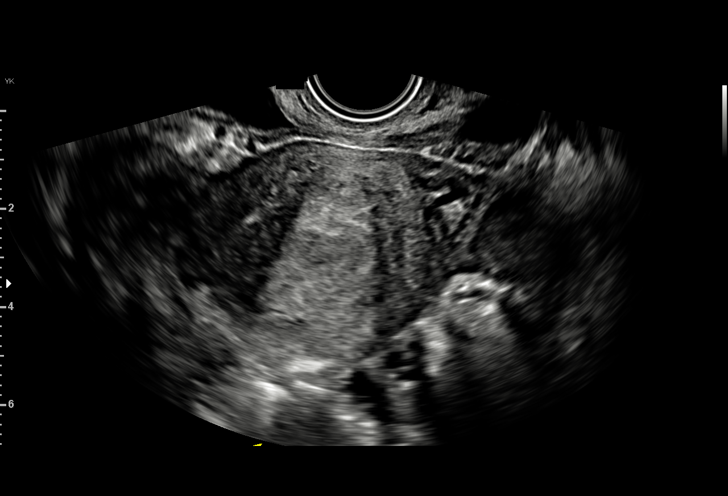
[im 24/46]
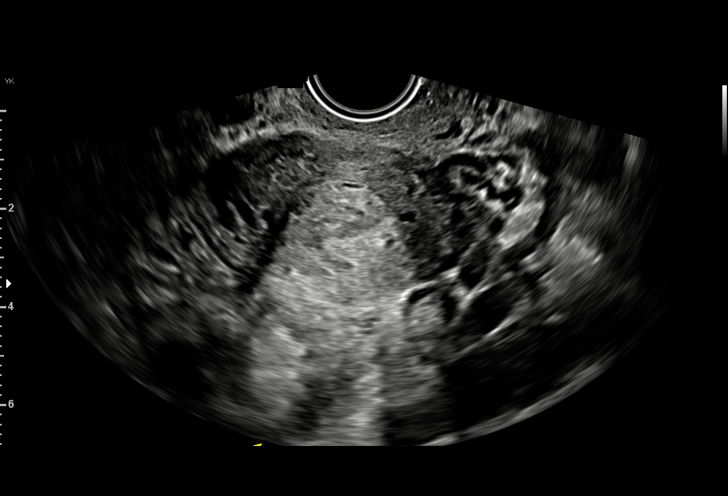
[im 26/46]
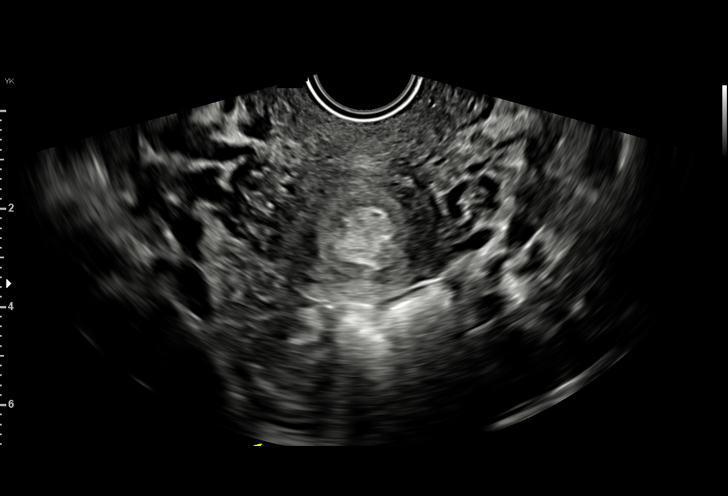
[im 29/46]
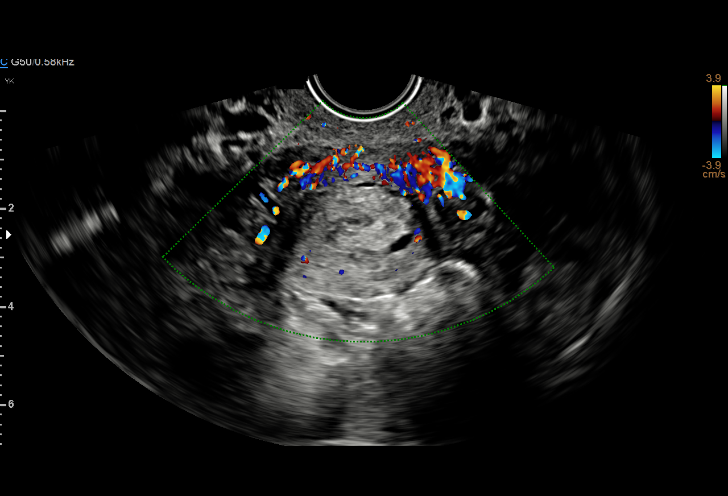
[im 32/46]
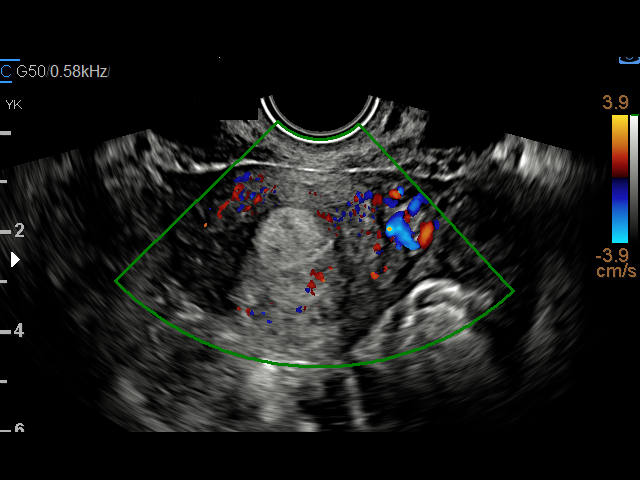
[im 36/46]
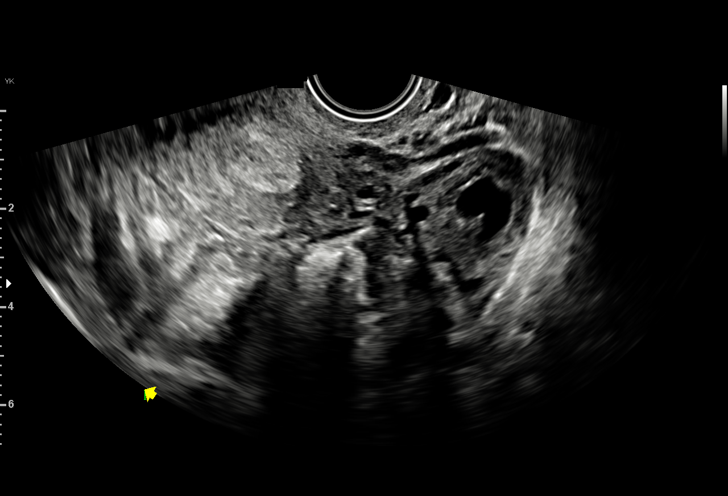
[im 39/46]
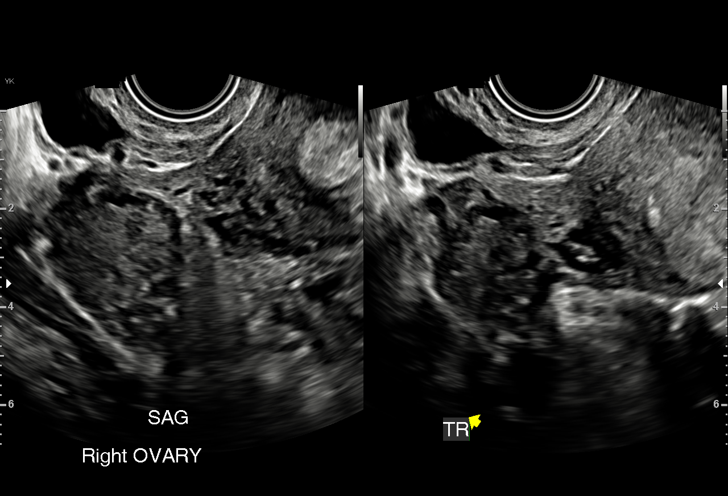
[im 42/46]
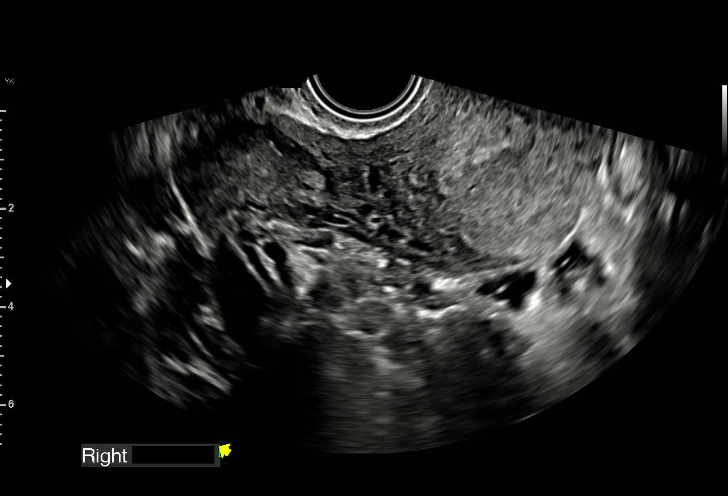
[im 46/46]
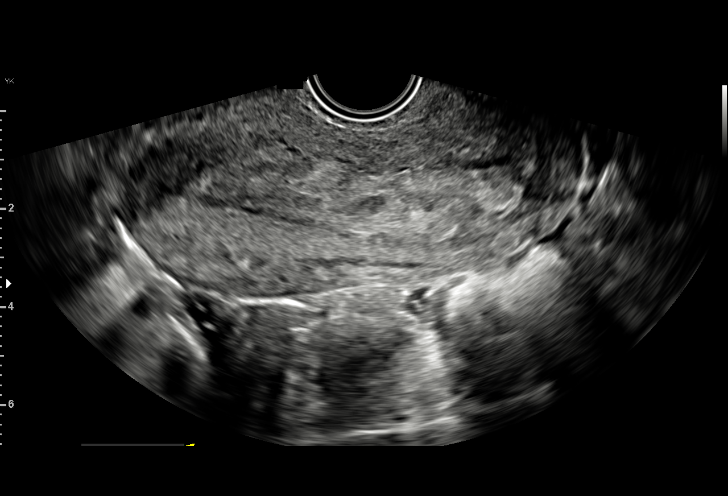

[15 of 28 positions shown; findings below may reference images not displayed]

FINDINGS: Intrauterine gestational sac: None

LMP: 09/19/2019.

Right ovary: There are multiple follicles seen in the RIGHT ovary.
The RIGHT ovary measures 3.6 x 2.7 x 2.6 cm. Blood flow is
documented within the RIGHT ovary.

Left ovary: There is a likely corpus luteal cyst within the LEFT
ovary. The LEFT ovary measures 2.8 x 3.4 x 2.6 cm. Blood flow is
documented within the LEFT ovary.

Other :Endometrium is diffusely thickened and heterogeneous in
appearance. It measures approximately 17 mm in thickness.

Free fluid:  None
IMPRESSION: 1. No intrauterine gestational sac, yolk sac, or fetal pole
identified. In the setting of positive pregnancy test and no
definite intrauterine pregnancy, this reflects a pregnancy of
unknown location. Differential considerations include early normal
IUP, abnormal IUP, or nonvisualized ectopic pregnancy.
Differentiation is achieved with serial beta HCG supplemented by
repeat sonography as clinically warranted.

## 2021-07-08 DIAGNOSIS — Z30013 Encounter for initial prescription of injectable contraceptive: Secondary | ICD-10-CM | POA: Diagnosis not present

## 2021-07-08 DIAGNOSIS — Z113 Encounter for screening for infections with a predominantly sexual mode of transmission: Secondary | ICD-10-CM | POA: Diagnosis not present
